# Patient Record
Sex: Female | Born: 1980
Health system: Southern US, Community
[De-identification: ages and names within clinical notes are randomized; demographics above are authoritative.]

## PROBLEM LIST (undated history)

## (undated) DIAGNOSIS — B019 Varicella without complication: Secondary | ICD-10-CM

## (undated) DIAGNOSIS — Z8619 Personal history of other infectious and parasitic diseases: Secondary | ICD-10-CM

## (undated) HISTORY — DX: Varicella without complication: B01.9

## (undated) HISTORY — PX: ECTOPIC PREGNANCY SURGERY: SHX613

---

## 2003-06-27 ENCOUNTER — Emergency Department (HOSPITAL_COMMUNITY): Admission: EM | Admit: 2003-06-27 | Discharge: 2003-06-27 | Payer: Self-pay | Admitting: Emergency Medicine

## 2004-01-04 ENCOUNTER — Emergency Department: Payer: Self-pay | Admitting: Emergency Medicine

## 2004-11-08 ENCOUNTER — Emergency Department (HOSPITAL_COMMUNITY): Admission: EM | Admit: 2004-11-08 | Discharge: 2004-11-08 | Payer: Self-pay | Admitting: Family Medicine

## 2005-07-02 ENCOUNTER — Emergency Department (HOSPITAL_COMMUNITY): Admission: EM | Admit: 2005-07-02 | Discharge: 2005-07-02 | Payer: Self-pay | Admitting: Emergency Medicine

## 2006-06-29 ENCOUNTER — Emergency Department (HOSPITAL_COMMUNITY): Admission: EM | Admit: 2006-06-29 | Discharge: 2006-06-29 | Payer: Self-pay | Admitting: Family Medicine

## 2013-04-12 ENCOUNTER — Other Ambulatory Visit (HOSPITAL_COMMUNITY): Payer: Self-pay | Admitting: Obstetrics and Gynecology

## 2013-04-12 DIAGNOSIS — IMO0002 Reserved for concepts with insufficient information to code with codable children: Secondary | ICD-10-CM

## 2013-04-18 ENCOUNTER — Ambulatory Visit (HOSPITAL_COMMUNITY)
Admission: RE | Admit: 2013-04-18 | Discharge: 2013-04-18 | Disposition: A | Discharge status: Home / Self Care | Payer: No Typology Code available for payment source | Source: Ambulatory Visit | Attending: Obstetrics and Gynecology | Admitting: Obstetrics and Gynecology

## 2013-04-18 DIAGNOSIS — N979 Female infertility, unspecified: Secondary | ICD-10-CM | POA: Insufficient documentation

## 2013-04-18 DIAGNOSIS — IMO0002 Reserved for concepts with insufficient information to code with codable children: Secondary | ICD-10-CM

## 2013-04-18 MED ORDER — IOHEXOL 300 MG/ML  SOLN
20.0000 mL | Freq: Once | INTRAMUSCULAR | Status: AC | PRN
  Administered 2013-04-18: 20 mL

## 2014-07-24 DIAGNOSIS — Z3183 Encounter for assisted reproductive fertility procedure cycle: Secondary | ICD-10-CM | POA: Insufficient documentation

## 2015-02-18 NOTE — L&D Delivery Note (Signed)
Pt was admitted by Dr. Rogue Bussing for an induction for postdates. She started on Pitocin. She progressed very slowly. Once she reached 6 cm she progressed along a nl labor curve. She developed variable decels txd with an amnioinfusion. She pushed for 1 1/2 hours. She had the VE placed to shorten the second stage in light of the decels. She delivered one live viable black female infant on second push, Nuchal cord x 1. Placenta-S/I. Left labial tear closed with 3-0 chromic. Baby to NBN. EBl-400cc. No pop offs

## 2015-02-20 MED FILL — PROGESTERONE OIL 50 MG/ML V: 50 | 20 days supply | Qty: 20 | Fill #0

## 2015-02-20 MED FILL — METHYLPREDNISOLONE 16 MG TA: 16 | 4 days supply | Qty: 4 | Fill #0

## 2015-02-21 MED FILL — BD NEEDLES 22GX1.5: 22G X 1-1/2 | 30 days supply | Qty: 30 | Fill #0

## 2015-03-02 DIAGNOSIS — Z3183 Encounter for assisted reproductive fertility procedure cycle: Secondary | ICD-10-CM | POA: Diagnosis not present

## 2015-03-08 DIAGNOSIS — Z3183 Encounter for assisted reproductive fertility procedure cycle: Secondary | ICD-10-CM | POA: Diagnosis not present

## 2015-03-10 ENCOUNTER — Encounter (HOSPITAL_COMMUNITY): Payer: Self-pay | Admitting: Emergency Medicine

## 2015-03-10 ENCOUNTER — Emergency Department (HOSPITAL_COMMUNITY)
Admission: EM | Admit: 2015-03-10 | Discharge: 2015-03-10 | Disposition: A | Discharge status: Home / Self Care | Payer: 59 | Attending: Emergency Medicine | Admitting: Emergency Medicine

## 2015-03-10 ENCOUNTER — Emergency Department (HOSPITAL_COMMUNITY): Payer: 59

## 2015-03-10 DIAGNOSIS — R079 Chest pain, unspecified: Secondary | ICD-10-CM | POA: Diagnosis not present

## 2015-03-10 DIAGNOSIS — R109 Unspecified abdominal pain: Secondary | ICD-10-CM | POA: Diagnosis not present

## 2015-03-10 DIAGNOSIS — R12 Heartburn: Secondary | ICD-10-CM | POA: Insufficient documentation

## 2015-03-10 DIAGNOSIS — K2100 Gastro-esophageal reflux disease with esophagitis, without bleeding: Secondary | ICD-10-CM

## 2015-03-10 DIAGNOSIS — R1013 Epigastric pain: Secondary | ICD-10-CM | POA: Diagnosis present

## 2015-03-10 LAB — CBC WITH DIFFERENTIAL/PLATELET
BASOS ABS: 0 10*3/uL (ref 0.0–0.1)
Basophils Relative: 0 %
Eosinophils Absolute: 0.1 10*3/uL (ref 0.0–0.7)
Eosinophils Relative: 2 %
HCT: 41.5 % (ref 36.0–46.0)
HEMOGLOBIN: 13.7 g/dL (ref 12.0–15.0)
LYMPHS ABS: 3 10*3/uL (ref 0.7–4.0)
LYMPHS PCT: 36 %
MCH: 29.1 pg (ref 26.0–34.0)
MCHC: 33 g/dL (ref 30.0–36.0)
MCV: 88.1 fL (ref 78.0–100.0)
Monocytes Absolute: 0.3 10*3/uL (ref 0.1–1.0)
Monocytes Relative: 4 %
NEUTROS ABS: 4.8 10*3/uL (ref 1.7–7.7)
NEUTROS PCT: 58 %
PLATELETS: 238 10*3/uL (ref 150–400)
RBC: 4.71 MIL/uL (ref 3.87–5.11)
RDW: 12.8 % (ref 11.5–15.5)
WBC: 8.2 10*3/uL (ref 4.0–10.5)

## 2015-03-10 LAB — COMPREHENSIVE METABOLIC PANEL
ALBUMIN: 3.4 g/dL — AB (ref 3.5–5.0)
ALT: 15 U/L (ref 14–54)
ANION GAP: 9 (ref 5–15)
AST: 19 U/L (ref 15–41)
Alkaline Phosphatase: 53 U/L (ref 38–126)
BUN: 10 mg/dL (ref 6–20)
CALCIUM: 9.4 mg/dL (ref 8.9–10.3)
CHLORIDE: 100 mmol/L — AB (ref 101–111)
CO2: 29 mmol/L (ref 22–32)
Creatinine, Ser: 0.87 mg/dL (ref 0.44–1.00)
GFR calc non Af Amer: >60 mL/min (ref >60)
GLUCOSE: 93 mg/dL (ref 65–99)
POTASSIUM: 3.9 mmol/L (ref 3.5–5.1)
Sodium: 138 mmol/L (ref 135–145)
Total Bilirubin: 0.3 mg/dL (ref 0.3–1.2)
Total Protein: 6.9 g/dL (ref 6.5–8.1)

## 2015-03-10 LAB — LIPASE, BLOOD: Lipase: 29 U/L (ref 11–51)

## 2015-03-10 MED ORDER — ONDANSETRON HCL 4 MG/2ML IJ SOLN
4.0000 mg | Freq: Once | INTRAMUSCULAR | Status: AC
Start: 2015-03-10 — End: 2015-03-10
  Administered 2015-03-10: 4 mg via INTRAVENOUS
  Filled 2015-03-10: qty 2

## 2015-03-10 MED ORDER — FAMOTIDINE 20 MG PO TABS: 20.0000 mg | ORAL_TABLET | Freq: Two times a day (BID) | ORAL | Status: DC

## 2015-03-10 MED ORDER — SODIUM CHLORIDE 0.9 % IV SOLN
INTRAVENOUS | Status: DC
  Administered 2015-03-10: 125 mL/h via INTRAVENOUS

## 2015-03-10 MED ORDER — HYDROMORPHONE HCL 1 MG/ML IJ SOLN
1.0000 mg | Freq: Once | INTRAMUSCULAR | Status: AC
  Administered 2015-03-10: 1 mg via INTRAVENOUS
  Filled 2015-03-10: qty 1

## 2015-03-10 MED ORDER — SODIUM CHLORIDE 0.9 % IV BOLUS (SEPSIS)
1000.0000 mL | Freq: Once | INTRAVENOUS | Status: AC
  Administered 2015-03-10: 1000 mL via INTRAVENOUS

## 2015-03-10 MED ORDER — SUCRALFATE 1 G PO TABS: 1.0000 g | ORAL_TABLET | Freq: Four times a day (QID) | ORAL | Status: DC

## 2015-03-10 MED ORDER — FAMOTIDINE IN NACL 20-0.9 MG/50ML-% IV SOLN
20.0000 mg | Freq: Once | INTRAVENOUS | Status: AC
Start: 2015-03-10 — End: 2015-03-10
  Administered 2015-03-10: 20 mg via INTRAVENOUS
  Filled 2015-03-10: qty 50

## 2015-03-10 NOTE — ED Provider Notes (Signed)
CSN: [id number]     Arrival date & time 03/10/15  0423 History   First MD Initiated Contact with Patient 03/10/15 [phone]     Chief Complaint  Patient presents with  . Abdominal Pain  . Chest Pain     (Consider location/radiation/quality/duration/timing/severity/associated sxs/prior Treatment) HPI Comments: Patient here complaining of epigastric pain that began after she ate a greasy meal. Had emesis 4 after this. Endorses heartburn as well as increased belching. Took Mylanta without relief. Father she was constipated into the laxative with also no relief. Denies any fever or chills. No diarrhea. Pain does radiate to her back and characterized as burning. Denies any anginal quality to this. Symptoms have been gradually getting better  Patient is a 35 y.o. female presenting with abdominal pain and chest pain. The history is provided by the patient.  Abdominal Pain Associated symptoms: chest pain   Chest Pain Associated symptoms: abdominal pain     History reviewed. No pertinent past medical history. Past Surgical History  Procedure Laterality Date  . Cesarean section     History reviewed. No pertinent family history. Social History  Substance Use Topics  . Smoking status: Never Smoker   . Smokeless tobacco: None  . Alcohol Use: No   OB History    No data available     Review of Systems  Cardiovascular: Positive for chest pain.  Gastrointestinal: Positive for abdominal pain.  All other systems reviewed and are negative.     Allergies  Review of patient's allergies indicates no known allergies.  Home Medications   Prior to Admission medications   Not on File   BP 144/92 mmHg  Pulse 82  Temp(Src) 98.1 F (36.7 C) (Oral)  Resp 20  Ht 5\' 9"  (1.753 m)  Wt 90.719 kg  BMI 29.52 kg/m2  SpO2 100% Physical Exam  Constitutional: She is oriented to person, place, and time. She appears well-developed and well-nourished.  Non-toxic appearance. No distress.  HENT:  Head:  Normocephalic and atraumatic.  Eyes: Conjunctivae, EOM and lids are normal. Pupils are equal, round, and reactive to light.  Neck: Normal range of motion. Neck supple. No tracheal deviation present. No thyroid mass present.  Cardiovascular: Normal rate, regular rhythm and normal heart sounds.  Exam reveals no gallop.   No murmur heard. Pulmonary/Chest: Effort normal and breath sounds normal. No stridor. No respiratory distress. She has no decreased breath sounds. She has no wheezes. She has no rhonchi. She has no rales.  Abdominal: Soft. Normal appearance and bowel sounds are normal. She exhibits no distension. There is tenderness in the epigastric area. There is no rigidity, no rebound, no guarding and no CVA tenderness.  Musculoskeletal: Normal range of motion. She exhibits no edema or tenderness.  Neurological: She is alert and oriented to person, place, and time. She has normal strength. No cranial nerve deficit or sensory deficit. GCS eye subscore is 4. GCS verbal subscore is 5. GCS motor subscore is 6.  Skin: Skin is warm and dry. No abrasion and no rash noted.  Psychiatric: She has a normal mood and affect. Her speech is normal and behavior is normal.  Nursing note and vitals reviewed.   ED Course  Procedures (including critical care time) Labs Review Labs Reviewed  CBC WITH DIFFERENTIAL/PLATELET  COMPREHENSIVE METABOLIC PANEL  LIPASE, BLOOD    Imaging Review No results found. I have personally reviewed and evaluated these images and lab results as part of my medical decision-making.   EKG Interpretation  Date/Time:  Saturday March 10 2015 04:34:24 EST Ventricular Rate:  88 PR Interval:  152 QRS Duration: 78 QT Interval:  338 QTC Calculation: 408 R Axis:   84 Text Interpretation:  Normal sinus rhythm Normal ECG Confirmed by Khari Mally   MD, Keira Bohlin ([id number]) on 03/10/2015 4:37:19 AM      MDM   Final diagnoses:  None    Pt feels better after meds, labs unremarkable,  suspect gerd, pt stable for d/c    Lacretia Leigh, MD 03/10/15 [phone]

## 2015-03-10 NOTE — ED Notes (Signed)
Pt states she has some abd pain and heartburn since 1 am, she is having some mid cp that goes to her back and to her right shoulder, pt took some Mylanta and a laxative with no relief. Pt having nausea and vomited 4 times since 1 am.

## 2015-03-10 NOTE — ED Notes (Signed)
MD at bedside. 

## 2015-03-10 NOTE — Discharge Instructions (Signed)
Esophagitis Esophagitis is inflammation of the esophagus. The esophagus is the tube that carries food and liquids from your mouth to your stomach. Esophagitis can cause soreness or pain in the esophagus. This condition can make it difficult and painful to swallow.  CAUSES Most causes of esophagitis are not serious. Common causes of this condition include:  Gastroesophageal reflux disease (GERD). This is when stomach contents move back up into the esophagus (reflux).  Repeated vomiting.  An allergic-type reaction, especially caused by food allergies (eosinophilic esophagitis).  Injury to the esophagus by swallowing large pills with or without water, or swallowing certain types of medicines.  Swallowing (ingesting) harmful chemicals, such as household cleaning products.  Heavy alcohol use.  An infection of the esophagus.This most often occurs in people who have a weakened immune system.  Radiation or chemotherapy treatment for cancer.  Certain diseases such as sarcoidosis, Crohn disease, and scleroderma. SYMPTOMS Symptoms of this condition include:  Difficult or painful swallowing.  Pain with swallowing acidic liquids, such as citrus juices.  Pain with burping.  Chest pain.  Difficulty breathing.  Nausea.  Vomiting.  Pain in the abdomen.  Weight loss.  Ulcers in the mouth.  Patches of white material in the mouth (candidiasis).  Fever.  Coughing up blood or vomiting blood.  Stool that is black, tarry, or bright red. DIAGNOSIS Your health care provider will take a medical history and perform a physical exam. You may also have other tests, including:  An endoscopy to examine your stomach and esophagus with a small camera.  A test that measures the acidity level in your esophagus.  A test that measures how much pressure is on your esophagus.  A barium swallow or modified barium swallow to show the shape, size, and functioning of your esophagus.  Allergy  tests. TREATMENT Treatment for this condition depends on the cause of your esophagitis. In some cases, steroids or other medicines may be given to help relieve your symptoms or to treat the underlying cause of your condition. You may have to make some lifestyle changes, such as:  Avoiding alcohol.  Quitting smoking.  Changing your diet.  Exercising.  Changing your sleep habits and your sleep environment. HOME CARE INSTRUCTIONS Take these actions to decrease your discomfort and to help avoid complications. Diet  Follow a diet as recommended by your health care provider. This may involve avoiding foods and drinks such as:  Coffee and tea (with or without caffeine).  Drinks that contain alcohol.  Energy drinks and sports drinks.  Carbonated drinks or sodas.  Chocolate and cocoa.  Peppermint and mint flavorings.  Garlic and onions.  Horseradish.  Spicy and acidic foods, including peppers, chili powder, curry powder, vinegar, hot sauces, and barbecue sauce.  Citrus fruit juices and citrus fruits, such as oranges, lemons, and limes.  Tomato-based foods, such as red sauce, chili, salsa, and pizza with red sauce.  Fried and fatty foods, such as donuts, french fries, potato chips, and high-fat dressings.  High-fat meats, such as hot dogs and fatty cuts of red and white meats, such as rib eye steak, sausage, ham, and bacon.  High-fat dairy items, such as whole milk, butter, and cream cheese.  Eat small, frequent meals instead of large meals.  Avoid drinking large amounts of liquid with your meals.  Avoid eating meals during the 2-3 hours before bedtime.  Avoid lying down right after you eat.  Do not exercise right after you eat.  Avoid foods and drinks that seem to  make your symptoms worse. General Instructions  Pay attention to any changes in your symptoms.  Take over-the-counter and prescription medicines only as told by your health care provider. Do not take  aspirin, ibuprofen, or other NSAIDs unless your health care provider told you to do so.  If you have trouble taking pills, use a pill splitter to decrease the size of the pill. This will decrease the chance of the pill getting stuck or injuring your esophagus on the way down. Also, drink water after you take a pill.  Do not use any tobacco products, including cigarettes, chewing tobacco, and e-cigarettes. If you need help quitting, ask your health care provider.  Wear loose-fitting clothing. Do not wear anything tight around your waist that causes pressure on your abdomen.  Raise (elevate) the head of your bed about 6 inches (15 cm).  Try to reduce your stress, such as with yoga or meditation. If you need help reducing stress, ask your health care provider.  If you are overweight, reduce your weight to an amount that is healthy for you. Ask your health care provider for guidance about a safe weight loss goal.  Keep all follow-up visits as told by your health care provider. This is important. SEEK MEDICAL CARE IF:  You have new symptoms.  You have unexplained weight loss.  You have difficulty swallowing, or it hurts to swallow.  You have wheezing or a persistent cough.  Your symptoms do not improve with treatment.  You have frequent heartburn for more than two weeks. SEEK IMMEDIATE MEDICAL CARE IF:  You have severe pain in your arms, neck, jaw, teeth, or back.  You feel sweaty, dizzy, or light-headed.  You have chest pain or shortness of breath.  You vomit and your vomit looks like blood or coffee grounds.  Your stool is bloody or black.  You have a fever.  You cannot swallow, drink, or eat.   This information is not intended to replace advice given to you by your health care provider. Make sure you discuss any questions you have with your health care provider.   Document Released: 03/13/2004 Document Revised: 10/25/2014 Document Reviewed: 05/31/2014 Elsevier Interactive  Patient Education 2016 [place]. Gastroesophageal Reflux Disease, Adult Normally, food travels down the esophagus and stays in the stomach to be digested. However, when a person has gastroesophageal reflux disease (GERD), food and stomach acid move back up into the esophagus. When this happens, the esophagus becomes sore and inflamed. Over time, GERD can create small holes (ulcers) in the lining of the esophagus.  CAUSES This condition is caused by a problem with the muscle between the esophagus and the stomach (lower esophageal sphincter, or LES). Normally, the LES muscle closes after food passes through the esophagus to the stomach. When the LES is weakened or abnormal, it does not close properly, and that allows food and stomach acid to go back up into the esophagus. The LES can be weakened by certain dietary substances, medicines, and medical conditions, including:  Tobacco use.  Pregnancy.  Having a hiatal hernia.  Heavy alcohol use.  Certain foods and beverages, such as coffee, chocolate, onions, and peppermint. RISK FACTORS This condition is more likely to develop in:  People who have an increased body weight.  People who have connective tissue disorders.  People who use NSAID medicines. SYMPTOMS Symptoms of this condition include:  Heartburn.  Difficult or painful swallowing.  The feeling of having a lump in the throat.  Abitter taste in  the mouth.  Bad breath.  Having a large amount of saliva.  Having an upset or bloated stomach.  Belching.  Chest pain.  Shortness of breath or wheezing.  Ongoing (chronic) cough or a night-time cough.  Wearing away of tooth enamel.  Weight loss. Different conditions can cause chest pain. Make sure to see your health care provider if you experience chest pain. DIAGNOSIS Your health care provider will take a medical history and perform a physical exam. To determine if you have mild or severe GERD, your health care  provider may also monitor how you respond to treatment. You may also have other tests, including:  An endoscopy toexamine your stomach and esophagus with a small camera.  A test thatmeasures the acidity level in your esophagus.  A test thatmeasures how much pressure is on your esophagus.  A barium swallow or modified barium swallow to show the shape, size, and functioning of your esophagus. TREATMENT The goal of treatment is to help relieve your symptoms and to prevent complications. Treatment for this condition may vary depending on how severe your symptoms are. Your health care provider may recommend:  Changes to your diet.  Medicine.  Surgery. HOME CARE INSTRUCTIONS Diet  Follow a diet as recommended by your health care provider. This may involve avoiding foods and drinks such as:  Coffee and tea (with or without caffeine).  Drinks that containalcohol.  Energy drinks and sports drinks.  Carbonated drinks or sodas.  Chocolate and cocoa.  Peppermint and mint flavorings.  Garlic and onions.  Horseradish.  Spicy and acidic foods, including peppers, chili powder, curry powder, vinegar, hot sauces, and barbecue sauce.  Citrus fruit juices and citrus fruits, such as oranges, lemons, and limes.  Tomato-based foods, such as red sauce, chili, salsa, and pizza with red sauce.  Fried and fatty foods, such as donuts, french fries, potato chips, and high-fat dressings.  High-fat meats, such as hot dogs and fatty cuts of red and white meats, such as rib eye steak, sausage, ham, and bacon.  High-fat dairy items, such as whole milk, butter, and cream cheese.  Eat small, frequent meals instead of large meals.  Avoid drinking large amounts of liquid with your meals.  Avoid eating meals during the 2-3 hours before bedtime.  Avoid lying down right after you eat.  Do not exercise right after you eat. General Instructions  Pay attention to any changes in your  symptoms.  Take over-the-counter and prescription medicines only as told by your health care provider. Do not take aspirin, ibuprofen, or other NSAIDs unless your health care provider told you to do so.  Do not use any tobacco products, including cigarettes, chewing tobacco, and e-cigarettes. If you need help quitting, ask your health care provider.  Wear loose-fitting clothing. Do not wear anything tight around your waist that causes pressure on your abdomen.  Raise (elevate) the head of your bed 6 inches (15cm).  Try to reduce your stress, such as with yoga or meditation. If you need help reducing stress, ask your health care provider.  If you are overweight, reduce your weight to an amount that is healthy for you. Ask your health care provider for guidance about a safe weight loss goal.  Keep all follow-up visits as told by your health care provider. This is important. SEEK MEDICAL CARE IF:  You have new symptoms.  You have unexplained weight loss.  You have difficulty swallowing, or it hurts to swallow.  You have wheezing or a  persistent cough.  Your symptoms do not improve with treatment.  You have a hoarse voice. SEEK IMMEDIATE MEDICAL CARE IF:  You have pain in your arms, neck, jaw, teeth, or back.  You feel sweaty, dizzy, or light-headed.  You have chest pain or shortness of breath.  You vomit and your vomit looks like blood or coffee grounds.  You faint.  Your stool is bloody or black.  You cannot swallow, drink, or eat.   This information is not intended to replace advice given to you by your health care provider. Make sure you discuss any questions you have with your health care provider.   Document Released: 11/13/2004 Document Revised: 10/25/2014 Document Reviewed: 05/31/2014 Elsevier Interactive Patient Education [employer].

## 2015-03-12 MED FILL — FLUCONAZOLE 150 MG TABLET: 150 | 1 days supply | Qty: 1 | Fill #0

## 2015-03-13 DIAGNOSIS — H5213 Myopia, bilateral: Secondary | ICD-10-CM | POA: Diagnosis not present

## 2015-03-16 DIAGNOSIS — Z3200 Encounter for pregnancy test, result unknown: Secondary | ICD-10-CM | POA: Diagnosis not present

## 2015-03-16 MED FILL — ESTRADIOL 2 MG TABLET: 2 | 30 days supply | Qty: 60 | Fill #1

## 2015-03-19 MED FILL — PROGESTERONE OIL 50 MG/ML V: 50 | 20 days supply | Qty: 20 | Fill #1

## 2015-03-20 DIAGNOSIS — Z3141 Encounter for fertility testing: Secondary | ICD-10-CM | POA: Diagnosis not present

## 2015-03-27 MED FILL — BD 3 ML SYRINGE 18GX1-1/2: 18G X 1-1/2 | 30 days supply | Qty: 50 | Fill #1

## 2015-03-27 MED FILL — ESTRADIOL 0.1 MG PATCH: 0.1 | 48 days supply | Qty: 16 | Fill #1

## 2015-04-02 DIAGNOSIS — Z3A00 Weeks of gestation of pregnancy not specified: Secondary | ICD-10-CM | POA: Diagnosis not present

## 2015-04-02 DIAGNOSIS — O09811 Supervision of pregnancy resulting from assisted reproductive technology, first trimester: Secondary | ICD-10-CM | POA: Diagnosis not present

## 2015-04-04 MED FILL — DICLEGIS DR 10-10 MG TABLET: 10-10 | 15 days supply | Qty: 30 | Fill #0

## 2015-04-05 MED FILL — BD NEEDLES 22GX1.5: 22G X 1-1/2 | 30 days supply | Qty: 30 | Fill #1

## 2015-04-05 MED FILL — PROGESTERONE OIL 50 MG/ML V: 50 | 20 days supply | Qty: 20 | Fill #2

## 2015-04-16 DIAGNOSIS — O09811 Supervision of pregnancy resulting from assisted reproductive technology, first trimester: Secondary | ICD-10-CM | POA: Diagnosis not present

## 2015-04-16 DIAGNOSIS — Z3A00 Weeks of gestation of pregnancy not specified: Secondary | ICD-10-CM | POA: Diagnosis not present

## 2015-04-24 DIAGNOSIS — O3680X1 Pregnancy with inconclusive fetal viability, fetus 1: Secondary | ICD-10-CM | POA: Diagnosis not present

## 2015-04-24 DIAGNOSIS — Z01419 Encounter for gynecological examination (general) (routine) without abnormal findings: Secondary | ICD-10-CM | POA: Diagnosis not present

## 2015-04-24 DIAGNOSIS — Z124 Encounter for screening for malignant neoplasm of cervix: Secondary | ICD-10-CM | POA: Diagnosis not present

## 2015-04-24 DIAGNOSIS — Z3480 Encounter for supervision of other normal pregnancy, unspecified trimester: Secondary | ICD-10-CM | POA: Diagnosis not present

## 2015-04-24 LAB — OB RESULTS CONSOLE ABO/RH: RH TYPE: POSITIVE

## 2015-04-24 LAB — OB RESULTS CONSOLE ANTIBODY SCREEN: Antibody Screen: NEGATIVE

## 2015-04-24 LAB — OB RESULTS CONSOLE RPR: RPR: NONREACTIVE

## 2015-04-24 LAB — OB RESULTS CONSOLE GC/CHLAMYDIA
Chlamydia: NEGATIVE
Gonorrhea: NEGATIVE

## 2015-04-24 LAB — OB RESULTS CONSOLE HEPATITIS B SURFACE ANTIGEN: HEP B S AG: NEGATIVE

## 2015-04-24 LAB — OB RESULTS CONSOLE HIV ANTIBODY (ROUTINE TESTING): HIV: NONREACTIVE

## 2015-04-24 LAB — OB RESULTS CONSOLE RUBELLA ANTIBODY, IGM: RUBELLA: IMMUNE

## 2015-04-25 MED FILL — PROGESTERONE OIL 50 MG/ML V: 50 | 20 days supply | Qty: 20 | Fill #3

## 2015-06-21 DIAGNOSIS — Z36 Encounter for antenatal screening of mother: Secondary | ICD-10-CM | POA: Diagnosis not present

## 2015-07-17 DIAGNOSIS — O359XX1 Maternal care for (suspected) fetal abnormality and damage, unspecified, fetus 1: Secondary | ICD-10-CM | POA: Diagnosis not present

## 2015-07-17 DIAGNOSIS — O09519 Supervision of elderly primigravida, unspecified trimester: Secondary | ICD-10-CM | POA: Diagnosis not present

## 2015-08-17 ENCOUNTER — Telehealth: Payer: Self-pay | Admitting: Cardiology

## 2015-08-22 NOTE — Telephone Encounter (Signed)
Closed encounter °

## 2015-09-05 DIAGNOSIS — Z3480 Encounter for supervision of other normal pregnancy, unspecified trimester: Secondary | ICD-10-CM | POA: Diagnosis not present

## 2015-09-05 DIAGNOSIS — Z23 Encounter for immunization: Secondary | ICD-10-CM | POA: Diagnosis not present

## 2015-09-21 ENCOUNTER — Ambulatory Visit: Payer: No Typology Code available for payment source | Admitting: Cardiology

## 2015-10-02 DIAGNOSIS — O368131 Decreased fetal movements, third trimester, fetus 1: Secondary | ICD-10-CM | POA: Diagnosis not present

## 2015-10-23 DIAGNOSIS — Z36 Encounter for antenatal screening of mother: Secondary | ICD-10-CM | POA: Diagnosis not present

## 2015-10-23 DIAGNOSIS — Z3480 Encounter for supervision of other normal pregnancy, unspecified trimester: Secondary | ICD-10-CM | POA: Diagnosis not present

## 2015-10-23 LAB — OB RESULTS CONSOLE GBS: GBS: POSITIVE

## 2015-11-06 DIAGNOSIS — O09523 Supervision of elderly multigravida, third trimester: Secondary | ICD-10-CM | POA: Diagnosis not present

## 2015-11-10 ENCOUNTER — Inpatient Hospital Stay (HOSPITAL_COMMUNITY)
Admission: AD | Admit: 2015-11-10 | Discharge: 2015-11-10 | Disposition: A | Discharge status: Home / Self Care | Payer: 59 | Source: Ambulatory Visit | Attending: Obstetrics & Gynecology | Admitting: Obstetrics & Gynecology

## 2015-11-10 ENCOUNTER — Encounter (HOSPITAL_COMMUNITY): Payer: Self-pay | Admitting: *Deleted

## 2015-11-10 DIAGNOSIS — O212 Late vomiting of pregnancy: Secondary | ICD-10-CM | POA: Diagnosis not present

## 2015-11-10 DIAGNOSIS — Z3A38 38 weeks gestation of pregnancy: Secondary | ICD-10-CM | POA: Diagnosis not present

## 2015-11-10 DIAGNOSIS — K529 Noninfective gastroenteritis and colitis, unspecified: Secondary | ICD-10-CM | POA: Diagnosis not present

## 2015-11-10 DIAGNOSIS — Z79899 Other long term (current) drug therapy: Secondary | ICD-10-CM | POA: Insufficient documentation

## 2015-11-10 DIAGNOSIS — O99613 Diseases of the digestive system complicating pregnancy, third trimester: Secondary | ICD-10-CM | POA: Diagnosis not present

## 2015-11-10 DIAGNOSIS — O218 Other vomiting complicating pregnancy: Secondary | ICD-10-CM | POA: Diagnosis present

## 2015-11-10 DIAGNOSIS — Z3689 Encounter for other specified antenatal screening: Secondary | ICD-10-CM

## 2015-11-10 DIAGNOSIS — Z3493 Encounter for supervision of normal pregnancy, unspecified, third trimester: Secondary | ICD-10-CM

## 2015-11-10 HISTORY — DX: Personal history of other infectious and parasitic diseases: Z86.19

## 2015-11-10 LAB — URINALYSIS, ROUTINE W REFLEX MICROSCOPIC
Bilirubin Urine: NEGATIVE
GLUCOSE, UA: NEGATIVE mg/dL
Hgb urine dipstick: NEGATIVE
Ketones, ur: 15 mg/dL — AB
LEUKOCYTES UA: NEGATIVE
Nitrite: NEGATIVE
PH: 6.5 (ref 5.0–8.0)
PROTEIN: NEGATIVE mg/dL
SPECIFIC GRAVITY, URINE: 1.02 (ref 1.005–1.030)

## 2015-11-10 MED ORDER — LACTATED RINGERS IV BOLUS (SEPSIS)
1000.0000 mL | Freq: Once | INTRAVENOUS | Status: AC
Start: 2015-11-10 — End: 2015-11-10
  Administered 2015-11-10: 1000 mL via INTRAVENOUS

## 2015-11-10 MED ORDER — PROMETHAZINE HCL 25 MG PO TABS: 25.0000 mg | ORAL_TABLET | Freq: Four times a day (QID) | ORAL | 0 refills | Status: DC | PRN

## 2015-11-10 MED ORDER — PROMETHAZINE HCL 25 MG PO TABS
25.0000 mg | ORAL_TABLET | Freq: Four times a day (QID) | ORAL | Status: DC | PRN
Start: 2015-11-10 — End: 2015-11-11
  Administered 2015-11-10: 25 mg via ORAL
  Filled 2015-11-10: qty 1

## 2015-11-10 NOTE — Discharge Instructions (Signed)

## 2015-11-10 NOTE — MAU Provider Note (Signed)
History     CSN: [id number]  Arrival date and time: 11/10/15 2018   First Provider Initiated Contact with Patient 11/10/15 2057      Chief Complaint  Patient presents with  . Emesis During Pregnancy   G1 @38 .6 weeks c/o N/V x24 hrs. She is able to tolerate fluids at times but not food. She denies sick contacts or food poisoning. She denies diarrhea. No fevers or chills. She reports good FM. No VB, LOF, or ctx. Pregnancy has been uncomplicated.     OB History    Gravida Para Term Preterm AB Living   3 1 1     1    SAB TAB Ectopic Multiple Live Births           1      Past Medical History:  Diagnosis Date  . H/O cold sores     Past Surgical History:  Procedure Laterality Date  . CESAREAN SECTION      Family History  Problem Relation Age of Onset  . Hypertension Mother     Social History  Substance Use Topics  . Smoking status: Never Smoker  . Smokeless tobacco: Never Used  . Alcohol use No    Allergies:  Allergies  Allergen Reactions  . Cephalexin Rash    Peeled skin    Prescriptions Prior to Admission  Medication Sig Dispense Refill Last Dose  . Prenatal Vit-Fe Fumarate-FA (PRENATAL MULTIVITAMIN) TABS tablet Take 1 tablet by mouth daily at 12 noon.   11/10/2015 at Unknown time  . PRESCRIPTION MEDICATION Valtrex as needed for cold sores   Past Week at Unknown time  . estradiol (ESTRACE) 2 MG tablet Take 2 mg by mouth 2 (two) times daily.   03/09/2015 at Unknown time  . estradiol (VIVELLE-DOT) 0.1 MG/24HR patch Place 1 patch onto the skin every 3 (three) days.   03/08/2015  . famotidine (PEPCID) 20 MG tablet Take 1 tablet (20 mg total) by mouth 2 (two) times daily. 30 tablet 0   . PROGESTERONE IM Inject 1 application into the muscle daily.   03/09/2015 at Unknown time  . sucralfate (CARAFATE) 1 g tablet Take 1 tablet (1 g total) by mouth 4 (four) times daily. 30 tablet 0     Review of Systems  Constitutional: Negative.   Gastrointestinal: Positive for  nausea and vomiting. Negative for abdominal pain.   Physical Exam   Blood pressure 131/77, pulse 91, temperature 98.3 F (36.8 C), resp. rate 18, height 5\' 9"  (1.753 m), weight 113 kg (249 lb 1.9 oz).  Physical Exam  Constitutional: She is oriented to person, place, and time. She appears well-developed and well-nourished.  HENT:  Head: Normocephalic and atraumatic.  Neck: Normal range of motion.  Cardiovascular: Normal rate.   Respiratory: Effort normal.  GI: Soft. She exhibits no distension. There is no tenderness.  gravid  Musculoskeletal: Normal range of motion.  Neurological: She is alert and oriented to person, place, and time.  Skin: Skin is warm and dry.  Psychiatric: She has a normal mood and affect.  EFM: 140 bpm, mod variability, + accels, no decels Toco: irregular, mild  Results for orders placed or performed during the hospital encounter of 11/10/15 (from the past 24 hour(s))  Urinalysis, Routine w reflex microscopic (not at [facility])     Status: Abnormal   Collection Time: 11/10/15  8:40 PM  Result Value Ref Range   Color, Urine YELLOW YELLOW   APPearance CLEAR CLEAR   Specific Gravity, Urine 1.020  1.005 - 1.030   pH 6.5 5.0 - 8.0   Glucose, UA NEGATIVE NEGATIVE mg/dL   Hgb urine dipstick NEGATIVE NEGATIVE   Bilirubin Urine NEGATIVE NEGATIVE   Ketones, ur 15 (A) NEGATIVE mg/dL   Protein, ur NEGATIVE NEGATIVE mg/dL   Nitrite NEGATIVE NEGATIVE   Leukocytes, UA NEGATIVE NEGATIVE    MAU Course  Procedures Phenergan 25mg  po x1 LR 1 L bolus  MDM Labs ordered and reviewed. Mild dehydration noted. Pt tolerating food and po fluids after meds. No nausea. No episodes of emesis. Discussed presentation, clinical findings, and plan with Dr. Alwyn Pea. Stable for discharge home.  Assessment and Plan   1. Third trimester pregnancy   2. Gastroenteritis   3. NST (non-stress test) reactive    Discharge home BRAT diet Rest Follow up in office this week as scheduled     Medication List    STOP taking these medications   estradiol 0.1 MG/24HR patch Commonly known as:  VIVELLE-DOT   estradiol 2 MG tablet Commonly known as:  ESTRACE   famotidine 20 MG tablet Commonly known as:  PEPCID   PROGESTERONE IM   sucralfate 1 g tablet Commonly known as:  CARAFATE     TAKE these medications   prenatal multivitamin Tabs tablet Take 1 tablet by mouth daily at 12 noon.   PRESCRIPTION MEDICATION Valtrex as needed for cold sores   promethazine 25 MG tablet Commonly known as:  PHENERGAN Take 1 tablet (25 mg total) by mouth every 6 (six) hours as needed for nausea or vomiting.       Julianne Handler, CNM 11/10/2015, 9:05 PM

## 2015-11-10 NOTE — MAU Note (Signed)
Started 2 days ago and had scratchy throat. Last night started vomiting everytime I ate. 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 No diarrhea. Denies LOF or fld

## 2015-11-10 NOTE — Progress Notes (Signed)
Ok to d/c efm per Colman Cater CNM

## 2015-11-12 ENCOUNTER — Telehealth (HOSPITAL_COMMUNITY): Payer: Self-pay | Admitting: *Deleted

## 2015-11-12 ENCOUNTER — Encounter (HOSPITAL_COMMUNITY): Payer: Self-pay | Admitting: *Deleted

## 2015-11-12 NOTE — Telephone Encounter (Signed)
Preadmission screen  

## 2015-11-13 DIAGNOSIS — O09519 Supervision of elderly primigravida, unspecified trimester: Secondary | ICD-10-CM | POA: Diagnosis not present

## 2015-11-13 DIAGNOSIS — Z23 Encounter for immunization: Secondary | ICD-10-CM | POA: Diagnosis not present

## 2015-11-18 ENCOUNTER — Inpatient Hospital Stay (HOSPITAL_COMMUNITY): Admission: RE | Admit: 2015-11-18 | Payer: No Typology Code available for payment source | Source: Ambulatory Visit

## 2015-11-19 ENCOUNTER — Telehealth (HOSPITAL_COMMUNITY): Payer: Self-pay | Admitting: *Deleted

## 2015-11-19 DIAGNOSIS — O09513 Supervision of elderly primigravida, third trimester: Secondary | ICD-10-CM | POA: Diagnosis not present

## 2015-11-19 NOTE — Telephone Encounter (Signed)
Preadmission screen  

## 2015-11-23 ENCOUNTER — Inpatient Hospital Stay (HOSPITAL_COMMUNITY): Payer: 59 | Admitting: Anesthesiology

## 2015-11-23 ENCOUNTER — Inpatient Hospital Stay (HOSPITAL_COMMUNITY)
Admission: RE | Admit: 2015-11-23 | Discharge: 2015-11-25 | DRG: 775 | Disposition: A | Discharge status: Home / Self Care | Payer: 59 | Source: Ambulatory Visit | Attending: Obstetrics and Gynecology | Admitting: Obstetrics and Gynecology

## 2015-11-23 ENCOUNTER — Encounter (HOSPITAL_COMMUNITY): Payer: Self-pay

## 2015-11-23 DIAGNOSIS — Z3490 Encounter for supervision of normal pregnancy, unspecified, unspecified trimester: Secondary | ICD-10-CM

## 2015-11-23 DIAGNOSIS — O480 Post-term pregnancy: Secondary | ICD-10-CM | POA: Diagnosis present

## 2015-11-23 DIAGNOSIS — Z3A40 40 weeks gestation of pregnancy: Secondary | ICD-10-CM

## 2015-11-23 DIAGNOSIS — O34219 Maternal care for unspecified type scar from previous cesarean delivery: Secondary | ICD-10-CM | POA: Diagnosis not present

## 2015-11-23 DIAGNOSIS — O34211 Maternal care for low transverse scar from previous cesarean delivery: Secondary | ICD-10-CM | POA: Diagnosis present

## 2015-11-23 LAB — CBC
HCT: 37.8 % (ref 36.0–46.0)
HEMATOCRIT: 37.3 % (ref 36.0–46.0)
HEMOGLOBIN: 12.9 g/dL (ref 12.0–15.0)
HEMOGLOBIN: 12.5 g/dL (ref 12.0–15.0)
MCH: 29.5 pg (ref 26.0–34.0)
MCH: 29.1 pg (ref 26.0–34.0)
MCHC: 34.1 g/dL (ref 30.0–36.0)
MCHC: 33.5 g/dL (ref 30.0–36.0)
MCV: 86.5 fL (ref 78.0–100.0)
MCV: 86.7 fL (ref 78.0–100.0)
PLATELETS: ADEQUATE 10*3/uL (ref 150–400)
Platelets: 251 10*3/uL (ref 150–400)
RBC: 4.37 MIL/uL (ref 3.87–5.11)
RBC: 4.3 MIL/uL (ref 3.87–5.11)
RDW: 14.4 % (ref 11.5–15.5)
RDW: 14.3 % (ref 11.5–15.5)
WBC: 7.1 10*3/uL (ref 4.0–10.5)
WBC: 8.1 10*3/uL (ref 4.0–10.5)

## 2015-11-23 LAB — TYPE AND SCREEN
ABO/RH(D): AB POS
ANTIBODY SCREEN: NEGATIVE

## 2015-11-23 LAB — ABO/RH: ABO/RH(D): AB POS

## 2015-11-23 LAB — RPR: RPR Ser Ql: NONREACTIVE

## 2015-11-23 MED ORDER — OXYCODONE-ACETAMINOPHEN 5-325 MG PO TABS: 1.0000 | ORAL_TABLET | ORAL | Status: DC | PRN

## 2015-11-23 MED ORDER — TERBUTALINE SULFATE 1 MG/ML IJ SOLN
0.2500 mg | Freq: Once | INTRAMUSCULAR | Status: DC | PRN
  Filled 2015-11-23: qty 1

## 2015-11-23 MED ORDER — OXYTOCIN 40 UNITS IN LACTATED RINGERS INFUSION - SIMPLE MED
1.0000 m[IU]/min | INTRAVENOUS | Status: DC
  Administered 2015-11-23: 3 m[IU]/min via INTRAVENOUS
  Administered 2015-11-23: 1 m[IU]/min via INTRAVENOUS
  Administered 2015-11-23: 4 m[IU]/min via INTRAVENOUS
  Administered 2015-11-24: 30 m[IU]/min via INTRAVENOUS
  Filled 2015-11-23 (×2): qty 1000

## 2015-11-23 MED ORDER — FLEET ENEMA 7-19 GM/118ML RE ENEM: 1.0000 | ENEMA | RECTAL | Status: DC | PRN

## 2015-11-23 MED ORDER — EPHEDRINE 5 MG/ML INJ
10.0000 mg | INTRAVENOUS | Status: DC | PRN
  Filled 2015-11-23: qty 4

## 2015-11-23 MED ORDER — LACTATED RINGERS IV SOLN
INTRAVENOUS | Status: DC
  Administered 2015-11-23 – 2015-11-24 (×3): via INTRAVENOUS

## 2015-11-23 MED ORDER — LACTATED RINGERS IV SOLN
500.0000 mL | INTRAVENOUS | Status: DC | PRN
Start: 2015-11-23 — End: 2015-11-24
  Administered 2015-11-24: 500 mL via INTRAVENOUS

## 2015-11-23 MED ORDER — DEXTROSE 5 % IV SOLN
5.0000 10*6.[IU] | Freq: Once | INTRAVENOUS | Status: AC
  Administered 2015-11-23: 5 10*6.[IU] via INTRAVENOUS
  Filled 2015-11-23: qty 5

## 2015-11-23 MED ORDER — DIPHENHYDRAMINE HCL 50 MG/ML IJ SOLN: 12.5000 mg | INTRAMUSCULAR | Status: DC | PRN

## 2015-11-23 MED ORDER — OXYTOCIN 40 UNITS IN LACTATED RINGERS INFUSION - SIMPLE MED: 2.5000 [IU]/h | INTRAVENOUS | Status: DC

## 2015-11-23 MED ORDER — PHENYLEPHRINE 40 MCG/ML (10ML) SYRINGE FOR IV PUSH (FOR BLOOD PRESSURE SUPPORT)
80.0000 ug | PREFILLED_SYRINGE | INTRAVENOUS | Status: DC | PRN
  Filled 2015-11-23: qty 5

## 2015-11-23 MED ORDER — OXYTOCIN BOLUS FROM INFUSION
500.0000 mL | Freq: Once | INTRAVENOUS | Status: AC
  Administered 2015-11-24: 500 mL/h via INTRAVENOUS

## 2015-11-23 MED ORDER — LIDOCAINE HCL (PF) 1 % IJ SOLN
INTRAMUSCULAR | Status: DC | PRN
  Administered 2015-11-23 (×2): 6 mL

## 2015-11-23 MED ORDER — LIDOCAINE HCL (PF) 1 % IJ SOLN
30.0000 mL | INTRAMUSCULAR | Status: DC | PRN
  Administered 2015-11-24: 30 mL via SUBCUTANEOUS
  Filled 2015-11-23: qty 30

## 2015-11-23 MED ORDER — SOD CITRATE-CITRIC ACID 500-334 MG/5ML PO SOLN: 30.0000 mL | ORAL | Status: DC | PRN

## 2015-11-23 MED ORDER — LACTATED RINGERS IV SOLN
500.0000 mL | Freq: Once | INTRAVENOUS | Status: AC
  Administered 2015-11-23: 500 mL via INTRAVENOUS

## 2015-11-23 MED ORDER — PENICILLIN G POTASSIUM 5000000 UNITS IJ SOLR
2.5000 10*6.[IU] | INTRAVENOUS | Status: DC
  Administered 2015-11-23 – 2015-11-24 (×7): 2.5 10*6.[IU] via INTRAVENOUS
  Filled 2015-11-23 (×11): qty 2.5

## 2015-11-23 MED ORDER — ONDANSETRON HCL 4 MG/2ML IJ SOLN
4.0000 mg | Freq: Four times a day (QID) | INTRAMUSCULAR | Status: DC | PRN
  Administered 2015-11-24 (×2): 4 mg via INTRAVENOUS
  Filled 2015-11-23 (×2): qty 2

## 2015-11-23 MED ORDER — PHENYLEPHRINE 40 MCG/ML (10ML) SYRINGE FOR IV PUSH (FOR BLOOD PRESSURE SUPPORT)
80.0000 ug | PREFILLED_SYRINGE | INTRAVENOUS | Status: DC | PRN
  Filled 2015-11-23: qty 10
  Filled 2015-11-23: qty 5

## 2015-11-23 MED ORDER — OXYCODONE-ACETAMINOPHEN 5-325 MG PO TABS: 2.0000 | ORAL_TABLET | ORAL | Status: DC | PRN

## 2015-11-23 MED ORDER — FENTANYL CITRATE (PF) 100 MCG/2ML IJ SOLN: 100.0000 ug | Freq: Once | INTRAMUSCULAR | Status: DC | PRN

## 2015-11-23 MED ORDER — FENTANYL 2.5 MCG/ML BUPIVACAINE 1/10 % EPIDURAL INFUSION (WH - ANES)
14.0000 mL/h | INTRAMUSCULAR | Status: DC | PRN
  Administered 2015-11-23 – 2015-11-24 (×2): 14 mL/h via EPIDURAL
  Filled 2015-11-23 (×2): qty 125

## 2015-11-23 MED ORDER — ACETAMINOPHEN 325 MG PO TABS
650.0000 mg | ORAL_TABLET | ORAL | Status: DC | PRN
  Administered 2015-11-24: 650 mg via ORAL
  Filled 2015-11-23: qty 2

## 2015-11-23 MED ORDER — LACTATED RINGERS IV SOLN: 500.0000 mL | Freq: Once | INTRAVENOUS | Status: DC

## 2015-11-23 NOTE — Anesthesia Procedure Notes (Signed)
Epidural Patient location during procedure: OB Start time: 11/23/2015 11:15 PM  Staffing Anesthesiologist: Franne Grip  Preanesthetic Checklist Completed: patient identified, site marked, surgical consent, pre-op evaluation, timeout performed, IV checked, risks and benefits discussed and monitors and equipment checked  Epidural Patient position: sitting Prep: DuraPrep Patient monitoring: blood pressure and heart rate Approach: midline Location: L3-L4 Injection technique: LOR saline  Needle:  Needle type: Tuohy  Needle gauge: 17 G Needle length: 9 cm Needle insertion depth: 6 cm Catheter type: closed end flexible Catheter size: 19 Gauge Catheter at skin depth: 14 cm Test dose: negative and Other  Assessment Events: blood not aspirated, injection not painful, no injection resistance, negative IV test and no paresthesia  Additional Notes Reason for block:procedure for pain

## 2015-11-23 NOTE — Anesthesia Pain Management Evaluation Note (Signed)
  CRNA Pain Management Visit Note  Patient: Sandra Roberts, 35 y.o., female  "Hello I am a member of the anesthesia team at [facility]. We have an anesthesia team available at all times to provide care throughout the hospital, including epidural management and anesthesia for C-section. I don't know your plan for the delivery whether it a natural birth, water birth, IV sedation, nitrous supplementation, doula or epidural, but we want to meet your pain goals."   1.Was your pain managed to your expectations on prior hospitalizations?     2.What is your expectation for pain management during this hospitalization?      3.How can we help you reach that goal?   Record the patient's initial score and the patient's pain goal.   Pain: 0  Pain Goal: 5 [facility] wants you to be able to say your pain was always managed very well.  [name] 11/23/2015

## 2015-11-23 NOTE — Anesthesia Preprocedure Evaluation (Signed)
Anesthesia Evaluation  Patient identified by MRN, date of birth, ID band Patient awake    Reviewed: Allergy & Precautions, NPO status , Patient's Chart, lab work & pertinent test results  Airway Mallampati: II  TM Distance: >3 FB Neck ROM: Full    Dental no notable dental hx.    Pulmonary neg pulmonary ROS,    Pulmonary exam normal breath sounds clear to auscultation       Cardiovascular negative cardio ROS Normal cardiovascular exam Rhythm:Regular Rate:Normal     Neuro/Psych negative neurological ROS  negative psych ROS   GI/Hepatic negative GI ROS, Neg liver ROS,   Endo/Other  negative endocrine ROS  Renal/GU negative Renal ROS  negative genitourinary   Musculoskeletal negative musculoskeletal ROS (+)   Abdominal (+) + obese,   Peds negative pediatric ROS (+)  Hematology negative hematology ROS (+)   Anesthesia Other Findings   Reproductive/Obstetrics negative OB ROS                             Anesthesia Physical Anesthesia Plan  ASA: II  Anesthesia Plan: Epidural   Post-op Pain Management:    Induction: Intravenous  Airway Management Planned: Natural Airway  Additional Equipment:   Intra-op Plan:   Post-operative Plan:   Informed Consent: I have reviewed the patients History and Physical, chart, labs and discussed the procedure including the risks, benefits and alternatives for the proposed anesthesia with the patient or authorized representative who has indicated his/her understanding and acceptance.     Plan Discussed with: CRNA  Anesthesia Plan Comments: (Informed consent obtained prior to proceeding including risk of failure, 1% risk of PDPH, risk of minor discomfort and bruising.  Discussed rare but serious complications including epidural abscess, permanent nerve injury, epidural hematoma.  Discussed alternatives to epidural analgesia and patient desires to  proceed.  Timeout performed pre-procedure verifying patient name, procedure, and platelet count.  Patient tolerated procedure well.)        Anesthesia Quick Evaluation

## 2015-11-24 ENCOUNTER — Encounter (HOSPITAL_COMMUNITY): Payer: Self-pay | Admitting: *Deleted

## 2015-11-24 ENCOUNTER — Inpatient Hospital Stay (HOSPITAL_COMMUNITY)
Admission: RE | Admit: 2015-11-24 | Payer: No Typology Code available for payment source | Source: Ambulatory Visit | Attending: Obstetrics and Gynecology | Admitting: Obstetrics and Gynecology

## 2015-11-24 DIAGNOSIS — Z3490 Encounter for supervision of normal pregnancy, unspecified, unspecified trimester: Secondary | ICD-10-CM

## 2015-11-24 MED ORDER — DIBUCAINE 1 % RE OINT: 1.0000 "application " | TOPICAL_OINTMENT | RECTAL | Status: DC | PRN

## 2015-11-24 MED ORDER — ZOLPIDEM TARTRATE 5 MG PO TABS: 5.0000 mg | ORAL_TABLET | Freq: Every evening | ORAL | Status: DC | PRN

## 2015-11-24 MED ORDER — COCONUT OIL OIL: 1.0000 "application " | TOPICAL_OIL | Status: DC | PRN

## 2015-11-24 MED ORDER — SENNOSIDES-DOCUSATE SODIUM 8.6-50 MG PO TABS
2.0000 | ORAL_TABLET | ORAL | Status: DC
  Administered 2015-11-24: 2 via ORAL
  Filled 2015-11-24: qty 2

## 2015-11-24 MED ORDER — SIMETHICONE 80 MG PO CHEW: 80.0000 mg | CHEWABLE_TABLET | ORAL | Status: DC | PRN

## 2015-11-24 MED ORDER — LACTATED RINGERS IV SOLN
INTRAVENOUS | Status: DC
  Administered 2015-11-24: 09:00:00 via INTRAUTERINE

## 2015-11-24 MED ORDER — ONDANSETRON HCL 4 MG/2ML IJ SOLN: 4.0000 mg | INTRAMUSCULAR | Status: DC | PRN

## 2015-11-24 MED ORDER — IBUPROFEN 600 MG PO TABS
600.0000 mg | ORAL_TABLET | Freq: Four times a day (QID) | ORAL | Status: DC
  Administered 2015-11-24 – 2015-11-25 (×3): 600 mg via ORAL
  Filled 2015-11-24 (×4): qty 1

## 2015-11-24 MED ORDER — ONDANSETRON HCL 4 MG PO TABS: 4.0000 mg | ORAL_TABLET | ORAL | Status: DC | PRN

## 2015-11-24 MED ORDER — BENZOCAINE-MENTHOL 20-0.5 % EX AERO: 1.0000 "application " | INHALATION_SPRAY | CUTANEOUS | Status: DC | PRN

## 2015-11-24 MED ORDER — MEASLES, MUMPS & RUBELLA VAC ~~LOC~~ INJ
0.5000 mL | INJECTION | Freq: Once | SUBCUTANEOUS | Status: DC
  Filled 2015-11-24: qty 0.5

## 2015-11-24 MED ORDER — TETANUS-DIPHTH-ACELL PERTUSSIS 5-2.5-18.5 LF-MCG/0.5 IM SUSP: 0.5000 mL | Freq: Once | INTRAMUSCULAR | Status: DC

## 2015-11-24 MED ORDER — WITCH HAZEL-GLYCERIN EX PADS: 1.0000 "application " | MEDICATED_PAD | CUTANEOUS | Status: DC | PRN

## 2015-11-24 MED ORDER — ACETAMINOPHEN 325 MG PO TABS: 650.0000 mg | ORAL_TABLET | ORAL | Status: DC | PRN

## 2015-11-24 NOTE — Anesthesia Rounding Note (Signed)
  CRNA Epidural Rounding Note  Patient: Sandra Roberts, 35 y.o., female  Patient's current pain level: Pain Score: 0-No pain (11/24/15 0231)  Agreed upon pain management level: Patient sleeping - unable to assess  Epidural intervention: No   Comments:   [facility] 11/24/2015

## 2015-11-24 NOTE — Lactation Note (Signed)
This note was copied from a baby's chart. Lactation Consultation Note  Patient Name: Sandra Roberts [id number] Date: 11/24/2015 Reason for consult: Initial assessment Baby 65 hours old. Mom reports that she attempted to nurse first child, 37 years old, but he "would not latch." Mom reports that she is able to get baby latched to left breast, but is having trouble latching to right breast. Assisted with latching baby to right breast is cross-cradle position. After several attempts and demonstrating to mom how to sandwich her breast, baby latched to right breast deeply. Baby suckled rhythmically with a few swallows noted. Discussed progression of milk coming to volume and supply and demand. Discussed with mom how to get her UMR pump from the store, and how to nurse/pump and return to work.   Mom given [facility] brochure, aware of OP/BFSG and [place] phone line assistance after D/C.   Maternal Data Has patient been taught Hand Expression?: Yes Does the patient have breastfeeding experience prior to this delivery?: Yes  Feeding Feeding Type: Breast Fed Length of feed: 30 min  LATCH Score/Interventions Latch: Repeated attempts needed to sustain latch, nipple held in mouth throughout feeding, stimulation needed to elicit sucking reflex. Intervention(s): Adjust position;Assist with latch;Breast compression  Audible Swallowing: A few with stimulation Intervention(s): Skin to skin;Hand expression  Type of Nipple: Everted at rest and after stimulation (right nipple short shaft)  Comfort (Breast/Nipple): Soft / non-tender     Hold (Positioning): Assistance needed to correctly position infant at breast and maintain latch. Intervention(s): Breastfeeding basics reviewed;Support Pillows;Position options;Skin to skin  LATCH Score: 7  Lactation Tools Discussed/Used     Consult Status Consult Status: Follow-up Date: 11/25/15 Follow-up type: In-patient    Andres Labrum 11/24/2015, 11:15  PM

## 2015-11-24 NOTE — Anesthesia Postprocedure Evaluation (Signed)
Anesthesia Post Note  Patient: Sandra Roberts  Procedure(s) Performed: * No procedures listed *  Patient location during evaluation: Mother Baby Anesthesia Type: Epidural Level of consciousness: awake and alert and oriented Pain management: satisfactory to patient Vital Signs Assessment: post-procedure vital signs reviewed and stable Respiratory status: spontaneous breathing and nonlabored ventilation Cardiovascular status: stable Postop Assessment: no headache, no backache, no signs of nausea or vomiting, adequate PO intake and patient able to bend at knees (patient up walking) Anesthetic complications: no     Last Vitals:  Vitals:   11/24/15 1345 11/24/15 1445  BP: 113/66 125/73  Pulse: (!) 113 100  Resp: 20 20  Temp: 37.1 C 36.6 C    Last Pain:  Vitals:   11/24/15 1445  TempSrc: Oral  PainSc:    Pain Goal: Patients Stated Pain Goal: 2 (11/23/15 2244)               Willa Rough

## 2015-11-25 LAB — CBC
HCT: 32.7 % — ABNORMAL LOW (ref 36.0–46.0)
Hemoglobin: 11.1 g/dL — ABNORMAL LOW (ref 12.0–15.0)
MCH: 29.6 pg (ref 26.0–34.0)
MCHC: 33.9 g/dL (ref 30.0–36.0)
MCV: 87.2 fL (ref 78.0–100.0)
PLATELETS: 221 10*3/uL (ref 150–400)
RBC: 3.75 MIL/uL — AB (ref 3.87–5.11)
RDW: 14.7 % (ref 11.5–15.5)
WBC: 18 10*3/uL — AB (ref 4.0–10.5)

## 2015-11-25 NOTE — Plan of Care (Signed)
Problem: Tissue Perfusion: Goal: Risk factors for ineffective tissue perfusion will decrease No sign of blood clots.  PAS hose initiated on admission

## 2015-11-25 NOTE — Discharge Summary (Signed)
Obstetric Discharge Summary Reason for Admission: induction of labor Prenatal Procedures: NST and ultrasound Intrapartum Procedures: vacuum Postpartum Procedures: none Complications-Operative and Postpartum: none Hemoglobin  Date Value Ref Range Status  11/25/2015 11.1 (L) 12.0 - 15.0 g/dL Final   HCT  Date Value Ref Range Status  11/25/2015 32.7 (L) 36.0 - 46.0 % Final    Physical Exam:  General: alert and cooperative Lochia: appropriate Uterine Fundus: firm   Discharge Diagnoses: AMA and history of prior C/S and variable decels  Discharge Information: Date: 11/25/2015 Activity: pelvic rest Diet: routine Medications: PNV and Ibuprofen Condition: stable Instructions: refer to practice specific booklet Discharge to: home   Newborn Data: Live born female  Birth Weight: 7 lb 2.3 oz (3240 g) APGAR: 8, 9  Home with mother.  ANDERSON,MARK E 11/25/2015, 11:32 AM

## 2015-11-25 NOTE — Lactation Note (Signed)
This note was copied from a baby's chart. Lactation Consultation Note  Baby recently bf for 20 min.  Parents deny problems. Mom encouraged to feed baby 8-12 times/24 hours and with feeding cues.  Discussed pumping and going back to work. Provided family with UMR pump. Reviewed engorgement care and monitoring voids/stools.   Patient Name: Sandra Roberts [id number] Date: 11/25/2015 Reason for consult: Follow-up assessment   Maternal Data    Feeding Feeding Type: Breast Fed Length of feed: 30 min  LATCH Score/Interventions                      Lactation Tools Discussed/Used     Consult Status Consult Status: Complete    Carlye Grippe 11/25/2015, 2:50 PM

## 2015-11-25 NOTE — Progress Notes (Signed)
PPD#1 Pt has no complaints. She would like to go home today. Lochia-wnl VSSAF IMP/stable Plan/ Will discharge

## 2015-12-04 ENCOUNTER — Ambulatory Visit (HOSPITAL_COMMUNITY)
Admission: RE | Admit: 2015-12-04 | Discharge: 2015-12-04 | Disposition: A | Discharge status: Home / Self Care | Payer: 59 | Source: Ambulatory Visit | Attending: Obstetrics and Gynecology | Admitting: Obstetrics and Gynecology

## 2015-12-04 ENCOUNTER — Telehealth (HOSPITAL_COMMUNITY): Payer: Self-pay | Admitting: Lactation Services

## 2015-12-04 NOTE — Telephone Encounter (Signed)
Baby is 63 days old. Mother called with questions regarding her milk supply.  She started supplementing with formula due to weight loss and jaundice.  She is breastfeeding baby, supplementing with formula and pumping approx 2 oz every 2 hours.  Infant's last weight was 6 lb 11 oz. Birth weight was 7 lb 2 oz.  Recommend she do power pumping session to give her milk supply a boost and hand express before and after pumping.  Also suggested she make OP appointment to check infant's latch.  Mother states she will call to make OP at a later date.  Will visit Pediatrician today.

## 2015-12-04 NOTE — Lactation Note (Signed)
Lactation Consult  Mother's reason for visit:  Requesting a NS, slow infant weight gain Visit Type:  Feeding Assessment Appointment Notes: Mom in requesting NS due to trouble latching, mom with compressible breasts and areola. Mom was able to latch infant easily on both breasts. Mom is noted to have a decreased supply and infant is sleepy at breast and transferred minimal milk for day of age. Used 5 fr feeding tube at breast with formula and infant got into a more rhythmic pattern and stayed at the breast longer than she usually does per mom. Mom is pumping and has noted an increase in supply. Lactation Cookie Recipe and information on increasing milk supply, Fenugreek, and Mother Love More milk Plus with instructions to call OB to inquire about herbs before taking. Advised mom to continue supplement and pumping. Follow up [place] apt 12/11/15 @ 1 pm. See plan at bottom of page. Consult:  Initial Lactation Consultant:  Carlye Grippe and Nonah Mattes RN [place]  ________________________________________________________________________ Sandra Roberts Name:  Sandra Roberts Date of Birth:  11/24/2017 Pediatrician:  Marciano Sequin Gender:  female Gestational Age: [redacted]w[redacted]d (At Birth) Birth Weight:  7 lb 2.3 oz (3240 g) Weight at Discharge:  Weight: 7 lb 1.8 oz (3225 g)               Date of Discharge:  11/25/2015     Filed Weights   11/24/15 1105 11/25/15 0000  Weight: 7 lb 2.3 oz (3240 g) 7 lb 1.8 oz (3225 g)  Last weight taken from location outside of [facility]:  6 lb 15 oz per mom    Location:Pediatrician's office Weight today:  7 lb 1.5 oz (3218 grams)      ________________________________________________________________________  Mother's Name: Sandra Roberts Type of delivery: Vaginal  Breastfeeding Experience: having difficultly latching infant, asking for a NS Maternal Medical Conditions:   Maternal Medications:     ________________________________________________________________________  Breastfeeding History (Post Discharge)  Frequency of breastfeeding:  Every 2-3 hours Duration of feeding:  5 minutes  Supplementation  Formula:  Volume 38ml Frequency:  2-3 hours  Total volume per day:  8 oz       Brand: Unknown  Breastmilk:  Volume 30 ml Frequency:  2-3 hours Total volume per day:  8 oz  Method:  Bottle,   Pumping  Type of pump:  Medela pump in style Frequency:  Every 2-3 hours Volume:  60 ml    Infant Intake and Output Assessment  Voids:  13 in 24 hrs.  Color:  Clear yellow Stools:  4 in 24 hrs.  Color:  Yellow  ________________________________________________________________________  Maternal Breast Assessment  Breast:  Soft Nipple:  Erect Pain level:  0  _______________________________________________________________________ Feeding Assessment/Evaluation   Initial feeding assessment:  Infant's oral assessment:  WNL  Positioning:  Cross cradle Right breast  LATCH documentation:  Latch:  1 = Repeated attempts needed to sustain latch, nipple held in mouth throughout feeding, stimulation needed to elicit sucking reflex.  Audible swallowing:  1 = A few with stimulation  Type of nipple:  2 = Everted at rest and after stimulation  Comfort (Breast/Nipple):  2 = Soft / non-tender  Hold (Positioning):  2 = No assistance needed to correctly position infant at breast  LATCH score:  8  Attached assessment:  Deep  Lips flanged:  Yes.    Lips untucked:  No.  Suck assessment:  Displays both   Pre-feed weight:  3218 g  (7 lb. 1.5 oz.) Post-feed  weight:  3236 g (7 lb. 202 oz.) Amount transferred:  18 ml Amount supplemented:  0 ml  Additional Feeding Assessment -   Infant's oral assessment:  WNL  Positioning:  Cross cradle Left breast  LATCH documentation:  Latch:  1 = Repeated attempts needed to sustain latch, nipple held in mouth throughout feeding, stimulation  needed to elicit sucking reflex.  Audible swallowing:  1 = A few with stimulation  Type of nipple:  2 = Everted at rest and after stimulation  Comfort (Breast/Nipple):  2 = Soft / non-tender  Hold (Positioning):  2 = No assistance needed to correctly position infant at breast  LATCH score:  8   Attached assessment:  Deep  Lips flanged:  Yes.    Lips untucked:  No.  Suck assessment:  Displays both  Tools:  Syringe with 5 Fr feeding tube Instructed on use and cleaning of tool:  Yes.    Pre-feed weight:  3236g  (7 lb. 2.2 oz.) Post-feed weight:  3262 g (7 lb. 3 oz.) Amount transferred:  26 ml Amount supplemented:  16 ml    Total amount transferred:  30 ml Total supplement given:  16 ml  Plan: Breastfeed infant at breat every 2-3 hours at first feeding cues Use 5 fr feeding tube at breast if desired Awaken infant as needed with feeding If not using 5 fr feeding tube at breast, supplement infant with bottle after BF with breast milk or formula of at least 30-40 cc/feeding Pump every 2-3 hours for 15-20 minutes or for about 2 minutes after milk flow stops Hand express post pumping Sleep when infant sleeps Keep up the awesome work Outpatient appt Tuesday December 11, 2015

## 2015-12-11 ENCOUNTER — Ambulatory Visit (HOSPITAL_COMMUNITY)
Admission: RE | Admit: 2015-12-11 | Discharge: 2015-12-11 | Disposition: A | Discharge status: Home / Self Care | Payer: 59 | Source: Ambulatory Visit | Attending: Obstetrics and Gynecology | Admitting: Obstetrics and Gynecology

## 2015-12-11 NOTE — Lactation Note (Signed)
Lactation Consult  Mother's reason for visit:   Visit Type:  OP Appointment Notes:  Pt is here today with Sandra Roberts to follow-up for slow weight gain.  She has been BF 3-4 times a day for 20 minutes and receiving about 6-7 2 oz bottles of formula or expressed breast milk. Today [name] attached to the left breast in a cross cradle hold.Mom was taught breast compression to keep her engaged.  She slowed down after 5 minutes but stayed on for 10 minutes. The transfer was 22 ml. Suck assessment was done and she was not pulling a gloved finger deeply into her mouth.  Tongue exercises were performed in an attempt to have carlie pull the finger back to the hard and soft palate juncture.  This was successful. Mother reported that baby latched deeper after this however transfer was only 13ml. Repositioned on the same side and an additional 18 was transferred. Lastly she was placed back on the first side for an additional 4 ml. Total transfer was 1.9 oz. SHe also stayed awake during the feeding and after the feeding. Mom reported that this feeding was better and that [name] was more engaged.  Plan: Work on positioning and deep latch Tongue exercises Breast compressions Feed for 40 minutes using breast compression and switching sides during the feeding Attend support group Post pump 4 times in 24 hours Make sure a good BF or pumping happens 8-10 times in 24 hours If necessary or desired ok to bottle feed expressed breast milk or formula  Consult:  Follow-Up Lactation Consultant:  Van Clines  ________________________________________________________________________ Sandra Roberts Name:  Sandra Roberts Date of Birth:  11/24/2017 Pediatrician:  Sabra Heck Gender:  female Gestational Age: [redacted]w[redacted]d (At Birth) Birth Weight:  7 lb 2.3 oz (3240 g) Weight at Discharge:  Weight: 7 lb 1.8 oz (3225 g)               Date of Discharge:  11/25/2015     Filed Weights   11/24/15 1105 11/25/15 0000  Weight: 7 lb 2.3 oz  (3240 g) 7 lb 1.8 oz (3225 g)   Weight today:  7#10.9 oz    ________________________________________________________________________  Mother's Name: Sandra Roberts   Breastfeeding Experience:  First baby _____________________________________________________   Voids:  12 in 24 hrs.  Color:  Clear yellow Stools:  4 in 24 hrs.  Color:  Yellow    _______________________________________________________________________

## 2015-12-24 MED FILL — METOCLOPRAMIDE 10 MG TABLET: 10 | 30 days supply | Qty: 120 | Fill #0

## 2015-12-28 MED FILL — VALACYCLOVIR HCL 500 MG TAB: 500 | 30 days supply | Qty: 30 | Fill #0

## 2016-05-13 DIAGNOSIS — Z124 Encounter for screening for malignant neoplasm of cervix: Secondary | ICD-10-CM | POA: Diagnosis not present

## 2016-05-13 DIAGNOSIS — Z01419 Encounter for gynecological examination (general) (routine) without abnormal findings: Secondary | ICD-10-CM | POA: Diagnosis not present

## 2016-05-13 DIAGNOSIS — Z6833 Body mass index (BMI) 33.0-33.9, adult: Secondary | ICD-10-CM | POA: Diagnosis not present

## 2016-07-02 DIAGNOSIS — H52223 Regular astigmatism, bilateral: Secondary | ICD-10-CM | POA: Diagnosis not present

## 2016-07-02 DIAGNOSIS — H5213 Myopia, bilateral: Secondary | ICD-10-CM | POA: Diagnosis not present

## 2016-09-08 ENCOUNTER — Telehealth: Payer: 59 | Admitting: Nurse Practitioner

## 2016-09-08 DIAGNOSIS — R21 Rash and other nonspecific skin eruption: Secondary | ICD-10-CM | POA: Diagnosis not present

## 2016-09-08 MED ORDER — PREDNISONE 10 MG (21) PO TBPK: ORAL_TABLET | ORAL | 0 refills | Status: AC

## 2016-09-08 MED ORDER — TRIAMCINOLONE ACETONIDE 0.1 % EX CREA: 1.0000 "application " | TOPICAL_CREAM | Freq: Two times a day (BID) | CUTANEOUS | 0 refills | Status: AC

## 2016-09-08 MED FILL — predniSONE 10 MG TABS: 10 | 6 days supply | Qty: 21 | Fill #0

## 2016-09-08 MED FILL — TRIAMCINOLONE ACETONIDE 0.1: 0.1 | 10 days supply | Qty: 30 | Fill #0

## 2016-09-08 NOTE — Addendum Note (Signed)
Addended by: Cari Caraway on: 09/08/2016 12:33 PM   Modules accepted: Orders

## 2016-09-08 NOTE — Addendum Note (Signed)
Addended by: Cari Caraway on: 09/08/2016 12:40 PM   Modules accepted: Orders

## 2016-09-08 NOTE — Progress Notes (Signed)
E Visit for Rash  We are sorry that you are not feeling well. Here is how we plan to help!   Based upon your presentation it appears you have a fungal infection.  I have prescribed: and Nystatin cream apply to the affected area twice daily Sorry my computer cut off in the middle of my statement. Again thanks for picture, but unfortunately it is difficult to tell exactly what is going on. Since nystatin cream was helping , I suggest you continue that for now. Keep ares as dry as possible and avoid scratching. If it is not improving you will need a face to face visit.   HOME CARE:   Take cool showers and avoid direct sunlight.  Apply cool compress or wet dressings.  Take a bath in an oatmeal bath.  Sprinkle content of one Aveeno packet under running faucet with comfortably warm water.  Bathe for 15-20 minutes, 1-2 times daily.  Pat dry with a towel. Do not rub the rash.  Use hydrocortisone cream.  Take an antihistamine like Benadryl for widespread rashes that itch.  The adult dose of Benadryl is 25-50 mg by mouth 4 times daily.  Caution:  This type of medication may cause sleepiness.  Do not drink alcohol, drive, or operate dangerous machinery while taking antihistamines.  Do not take these medications if you have prostate enlargement.  Read package instructions thoroughly on all medications that you take.  GET HELP RIGHT AWAY IF:   Symptoms don't go away after treatment.  Severe itching that persists.  If you rash spreads or swells.  If you rash begins to smell.  If it blisters and opens or develops a yellow-brown crust.  You develop a fever.  You have a sore throat.  You become short of breath.  MAKE SURE YOU:  Understand these instructions. Will watch your condition. Will get help right away if you are not doing well or get worse.  Thank you for choosing an e-visit. Your e-visit answers were reviewed by a board certified advanced clinical practitioner to complete  your personal care plan. Depending upon the condition, your plan could have included both over the counter or prescription medications. Please review your pharmacy choice. Be sure that the pharmacy you have chosen is open so that you can pick up your prescription now.  If there is a problem you may message your provider in [place] to have the prescription routed to another pharmacy. Your safety is important to [place]. If you have drug allergies check your prescription carefully.  For the next 24 hours, you can use MyChart to ask questions about today's visit, request a non-urgent call back, or ask for a work or school excuse from your e-visit provider. You will get an email in the next two days asking about your experience. I hope that your e-visit has been valuable and will speed your recovery.

## 2016-10-08 DIAGNOSIS — N760 Acute vaginitis: Secondary | ICD-10-CM | POA: Diagnosis not present

## 2016-10-08 MED FILL — FLUCONAZOLE 150 MG TABLET: 150 | 2 days supply | Qty: 2 | Fill #0

## 2016-10-30 DIAGNOSIS — R040 Epistaxis: Secondary | ICD-10-CM | POA: Diagnosis not present

## 2016-10-31 DIAGNOSIS — Z7289 Other problems related to lifestyle: Secondary | ICD-10-CM | POA: Diagnosis not present

## 2016-10-31 DIAGNOSIS — R040 Epistaxis: Secondary | ICD-10-CM | POA: Diagnosis not present

## 2016-11-19 DIAGNOSIS — D239 Other benign neoplasm of skin, unspecified: Secondary | ICD-10-CM | POA: Diagnosis not present

## 2016-11-19 DIAGNOSIS — L239 Allergic contact dermatitis, unspecified cause: Secondary | ICD-10-CM | POA: Diagnosis not present

## 2016-11-19 MED FILL — hydrOXYzine HCL 10 MG TABS: 10 | 30 days supply | Qty: 60 | Fill #0

## 2016-11-25 MED FILL — VALACYCLOVIR HCL 500 MG TAB: 500 | 30 days supply | Qty: 30 | Fill #1

## 2016-12-15 MED FILL — FLUCONAZOLE 150 MG TABLET: 150 | 2 days supply | Qty: 2 | Fill #0

## 2017-03-30 ENCOUNTER — Ambulatory Visit: Payer: Self-pay | Admitting: Emergency Medicine

## 2017-03-30 VITALS — BP 114/76 | HR 119 | Temp 99.6°F | Resp 19

## 2017-03-30 DIAGNOSIS — R52 Pain, unspecified: Secondary | ICD-10-CM

## 2017-03-30 DIAGNOSIS — N979 Female infertility, unspecified: Secondary | ICD-10-CM | POA: Insufficient documentation

## 2017-03-30 DIAGNOSIS — R6889 Other general symptoms and signs: Secondary | ICD-10-CM

## 2017-03-30 LAB — POCT INFLUENZA A/B
INFLUENZA A, POC: NEGATIVE
INFLUENZA B, POC: NEGATIVE

## 2017-03-30 MED ORDER — OSELTAMIVIR PHOSPHATE 75 MG PO CAPS: 75.0000 mg | ORAL_CAPSULE | Freq: Two times a day (BID) | ORAL | 0 refills | Status: DC

## 2017-03-30 MED FILL — OSELTAMIVIR PHOSPHATE 75 MG: 75 | 5 days supply | Qty: 10 | Fill #0

## 2017-03-30 NOTE — Patient Instructions (Signed)

## 2017-03-30 NOTE — Progress Notes (Signed)
S: Sandra Roberts is a 37 y.o. female who presents for fever, chills, myalgias, and fatigue. 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 She has received flu shot this year. Symptoms are helped with Tylenol. Has not had other OTC therapies. Denies cough, N/V/D. Sore throat. No Hx of asthma or chronic lung disease, LMP on 03/10/17.  Review of Systems  Constitutional: Positive for chills, fever and malaise/fatigue.  HENT: Positive for congestion. Negative for sinus pain and sore throat.   Respiratory: Negative for cough, shortness of breath and wheezing.   Cardiovascular: Negative for chest pain and palpitations.  Gastrointestinal: Negative for diarrhea, nausea and vomiting.  Musculoskeletal: Positive for myalgias.  Neurological: Negative.    O: Vitals:   03/30/17 1117  BP: 114/76  Pulse: (!) 119  Resp: 19  Temp: 99.6 F (37.6 C)  SpO2: 99%   Physical Exam  Constitutional: She is oriented to person, place, and time. She appears well-developed and well-nourished. No distress.  HENT:  Head: Normocephalic and atraumatic.  Right Ear: Tympanic membrane and external ear normal.  Left Ear: Tympanic membrane and external ear normal.  Nose: Nose normal.  Mouth/Throat: Uvula is midline and oropharynx is clear and moist.  Eyes: Conjunctivae are normal.  Neck: Normal range of motion. Neck supple.  Cardiovascular: Regular rhythm. Tachycardia present.  Pulmonary/Chest: Effort normal.  Lymphadenopathy:    She has no cervical adenopathy.  Neurological: She is alert and oriented to person, place, and time.  Skin: Skin is warm. Capillary refill takes less than 2 seconds. She is not diaphoretic.  Psychiatric: She has a normal mood and affect.  Vitals reviewed.   A: Viral illness, flu like symptoms  P: Known exposure to flu A, considering possibility of false negative on POCT. Provided counseling on risks/benefits of medication, pt requested Rx of Tamiflu.  Recommend rest, fluids, tylenol or motrin as needed, follow up as needed.

## 2017-04-20 ENCOUNTER — Ambulatory Visit (INDEPENDENT_AMBULATORY_CARE_PROVIDER_SITE_OTHER): Payer: No Typology Code available for payment source | Admitting: Family Medicine

## 2017-04-20 ENCOUNTER — Encounter: Payer: Self-pay | Admitting: Family Medicine

## 2017-04-20 VITALS — BP 100/60 | HR 82 | Temp 98.3°F | Ht 69.0 in | Wt 218.0 lb

## 2017-04-20 DIAGNOSIS — Z0000 Encounter for general adult medical examination without abnormal findings: Secondary | ICD-10-CM

## 2017-04-20 DIAGNOSIS — Z1329 Encounter for screening for other suspected endocrine disorder: Secondary | ICD-10-CM

## 2017-04-20 DIAGNOSIS — R5383 Other fatigue: Secondary | ICD-10-CM

## 2017-04-20 DIAGNOSIS — Z13220 Encounter for screening for lipoid disorders: Secondary | ICD-10-CM | POA: Diagnosis not present

## 2017-04-20 NOTE — Patient Instructions (Addendum)
Preventive Care 18-39 Years, Female Preventive care refers to lifestyle choices and visits with your health care provider that can promote health and wellness. What does preventive care include?  A yearly physical exam. This is also called an annual well check.  Dental exams once or twice a year.  Routine eye exams. Ask your health care provider how often you should have your eyes checked.  Personal lifestyle choices, including: ? Daily care of your teeth and gums. ? Regular physical activity. ? Eating a healthy diet. ? Avoiding tobacco and drug use. ? Limiting alcohol use. ? Practicing safe sex. ? Taking vitamin and mineral supplements as recommended by your health care provider. What happens during an annual well check? The services and screenings done by your health care provider during your annual well check will depend on your age, overall health, lifestyle risk factors, and family history of disease. Counseling Your health care provider may ask you questions about your:  Alcohol use.  Tobacco use.  Drug use.  Emotional well-being.  Home and relationship well-being.  Sexual activity.  Eating habits.  Work and work Statistician.  Method of birth control.  Menstrual cycle.  Pregnancy history.  Screening You may have the following tests or measurements:  Height, weight, and BMI.  Diabetes screening. This is done by checking your blood sugar (glucose) after you have not eaten for a while (fasting).  Blood pressure.  Lipid and cholesterol levels. These may be checked every 5 years starting at age 66.  Skin check.  Hepatitis C blood test.  Hepatitis B blood test.  Sexually transmitted disease (STD) testing.  BRCA-related cancer screening. This may be done if you have a family history of breast, ovarian, tubal, or peritoneal cancers.  Pelvic exam and Pap test. This may be done every 3 years starting at age 40. Starting at age 59, this may be done every 5  years if you have a Pap test in combination with an HPV test.  Discuss your test results, treatment options, and if necessary, the need for more tests with your health care provider. Vaccines Your health care provider may recommend certain vaccines, such as:  Influenza vaccine. This is recommended every year.  Tetanus, diphtheria, and acellular pertussis (Tdap, Td) vaccine. You may need a Td booster every 10 years.  Varicella vaccine. You may need this if you have not been vaccinated.  HPV vaccine. If you are 69 or younger, you may need three doses over 6 months.  Measles, mumps, and rubella (MMR) vaccine. You may need at least one dose of MMR. You may also need a second dose.  Pneumococcal 13-valent conjugate (PCV13) vaccine. You may need this if you have certain conditions and were not previously vaccinated.  Pneumococcal polysaccharide (PPSV23) vaccine. You may need one or two doses if you smoke cigarettes or if you have certain conditions.  Meningococcal vaccine. One dose is recommended if you are age 27-21 years and a first-year college student living in a residence hall, or if you have one of several medical conditions. You may also need additional booster doses.  Hepatitis A vaccine. You may need this if you have certain conditions or if you travel or work in places where you may be exposed to hepatitis A.  Hepatitis B vaccine. You may need this if you have certain conditions or if you travel or work in places where you may be exposed to hepatitis B.  Haemophilus influenzae type b (Hib) vaccine. You may need this if  you have certain risk factors.  Talk to your health care provider about which screenings and vaccines you need and how often you need them. This information is not intended to replace advice given to you by your health care provider. Make sure you discuss any questions you have with your health care provider. Document Released: 04/01/2001 Document Revised: 10/24/2015  Document Reviewed: 12/05/2014 Elsevier Interactive Patient Education  2018 [employer].  Fatigue Fatigue is feeling tired all of the time, a lack of energy, or a lack of motivation. Occasional or mild fatigue is often a normal response to activity or life in general. However, long-lasting (chronic) or extreme fatigue may indicate an underlying medical condition. Follow these instructions at home: Watch your fatigue for any changes. The following actions may help to lessen any discomfort you are feeling:  Talk to your health care provider about how much sleep you need each night. Try to get the required amount every night.  Take medicines only as directed by your health care provider.  Eat a healthy and nutritious diet. Ask your health care provider if you need help changing your diet.  Drink enough fluid to keep your urine clear or pale yellow.  Practice ways of relaxing, such as yoga, meditation, massage therapy, or acupuncture.  Exercise regularly.  Change situations that cause you stress. Try to keep your work and personal routine reasonable.  Do not abuse illegal drugs.  Limit alcohol intake to no more than 1 drink per day for nonpregnant women and 2 drinks per day for men. One drink equals 12 ounces of beer, 5 ounces of wine, or 1 ounces of hard liquor.  Take a multivitamin, if directed by your health care provider.  Contact a health care provider if:  Your fatigue does not get better.  You have a fever.  You have unintentional weight loss or gain.  You have headaches.  You have difficulty: ? Falling asleep. ? Sleeping throughout the night.  You feel angry, guilty, anxious, or sad.  You are unable to have a bowel movement (constipation).  You skin is dry.  Your legs or another part of your body is swollen. Get help right away if:  You feel confused.  Your vision is blurry.  You feel faint or pass out.  You have a severe headache.  You have severe  abdominal, pelvic, or back pain.  You have chest pain, shortness of breath, or an irregular or fast heartbeat.  You are unable to urinate or you urinate less than normal.  You develop abnormal bleeding, such as bleeding from the rectum, vagina, nose, lungs, or nipples.  You vomit blood.  You have thoughts about harming yourself or committing suicide.  You are worried that you might harm someone else. This information is not intended to replace advice given to you by your health care provider. Make sure you discuss any questions you have with your health care provider. Document Released: 12/01/2006 Document Revised: 07/12/2015 Document Reviewed: 06/07/2013 Elsevier Interactive Patient Education  [employer].

## 2017-04-20 NOTE — Progress Notes (Signed)
Patient presents to clinic today for CPE and to establish care.  SUBJECTIVE: PMH: Patient is a 37 year old female past medical history significant for herpes labialis.  Fatigue: -Patient endorses feeling tired -Patient does have a child under 2 and works full-time. -Patient does endorse having anemia in the past  Herpes labialis: -Patient endorses ongoing history of cold sores -May take Valtrex p.o. for this  Allergies: cephalexin-rash, skin peeling NKFA  Past surgical history: C-section 2002  Social history: Patient is married she is currently employed as a Marine scientist in the ICU.  Patient has 2 children.  Patient denies tobacco, drug use.  Patient endorses social alcohol use.  Family medical history: Mom-alive, HTN Dad-alive, prostate cancer Sister-alive Daughter-asthma MGM-hearing loss PGM-hearing loss, MI, heart disease, HLD, stroke PGF-hearing loss, MI, heart disease, HLD, stroke  Health Maintenance: Dental --[facility] Immunizations --influenza vaccine 2018 PAP --3/18   Past Medical History:  Diagnosis Date  . H/O cold sores     Past Surgical History:  Procedure Laterality Date  . CESAREAN SECTION    . ECTOPIC PREGNANCY SURGERY      Current Outpatient Medications on File Prior to Visit  Medication Sig Dispense Refill  . Prenatal Vit-Fe Fumarate-FA (PRENATAL MULTIVITAMIN) TABS tablet Take 1 tablet by mouth daily.      No current facility-administered medications on file prior to visit.     Allergies  Allergen Reactions  . Cephalexin Rash    Family History  Problem Relation Age of Onset  . Hypertension Mother   . Cancer Maternal Grandmother   . Hearing loss Maternal Grandmother   . Cancer Father   . Asthma Daughter   . Hearing loss Paternal Grandmother   . Heart disease Paternal Grandmother   . Hypertension Paternal Grandmother   . Hyperlipidemia Paternal Grandmother   . Stroke Paternal Grandmother   . Hearing loss  Paternal Grandfather   . Heart disease Paternal Grandfather   . Hyperlipidemia Paternal Grandfather   . Hypertension Paternal Grandfather   . Stroke Paternal Grandfather     Social History   Socioeconomic History  . Marital status: Single    Spouse name: Not on file  . Number of children: Not on file  . Years of education: Not on file  . Highest education level: Not on file  Social Needs  . Financial resource strain: Not on file  . Food insecurity - worry: Not on file  . Food insecurity - inability: Not on file  . Transportation needs - medical: Not on file  . Transportation needs - non-medical: Not on file  Occupational History  . Not on file  Tobacco Use  . Smoking status: Never Smoker  . Smokeless tobacco: Never Used  Substance and Sexual Activity  . Alcohol use: No  . Drug use: No  . Sexual activity: Not on file  Other Topics Concern  . Not on file  Social History Narrative  . Not on file    ROS General: Denies fever, chills, night sweats, changes in weight, changes in appetite  +fatigue HEENT: Denies headaches, ear pain, changes in vision, rhinorrhea, sore throat CV: Denies CP, palpitations, SOB, orthopnea Pulm: Denies SOB, cough, wheezing GI: Denies abdominal pain, nausea, vomiting, diarrhea, constipation GU: Denies dysuria, hematuria, frequency, vaginal discharge Msk: Denies muscle cramps, joint pains Neuro: Denies weakness, numbness, tingling Skin: Denies rashes, bruising Psych: Denies depression, anxiety, hallucinations  BP 100/60 (BP Location: Right Arm, Patient Position: Sitting, Cuff Size: Large)   Pulse 82  Temp 98.3 F (36.8 C) (Oral)   Ht 5\' 9"  (1.753 m)   Wt 218 lb (98.9 kg)   LMP 04/13/2017   SpO2 100%   Breastfeeding? No   BMI 32.19 kg/m   Physical Exam Gen. Pleasant, well developed, well-nourished, in NAD HEENT - [place]/AT, PERRL, no scleral icterus, no nasal drainage, pharynx without erythema or exudate.  TMs normal bilaterally.  No  cervical lymphadenopathy. Neck: No JVD, no thyromegaly, no carotid bruits Lungs: no use of accessory muscles, CTAB, no wheezes, rales or rhonchi Cardiovascular: RRR,  No r/g/m, no peripheral edema Abdomen: BS present, soft, nontender,nondistended, no hepatosplenomegaly Musculoskeletal: No deformities, moves all four extremities, no cyanosis or clubbing, normal tone Neuro:  A&Ox3, CN II-XII intact, normal gait Skin:  Warm, dry, intact, no lesions Psych: normal affect, mood appropriate  Recent Results (from the past 2160 hour(s))  POCT Influenza A/B     Status: Normal   Collection Time: 03/30/17 12:03 PM  Result Value Ref Range   Influenza A, POC Negative Negative   Influenza B, POC Negative Negative    Assessment/Plan: Well adult exam -Anticipatory guidance given including wearing seatbelts, smoke detectors in the home, increasing physical activity, increasing p.o. intake of water, increasing po intake of vegetables -Handout given -Pap up-to-date - Plan: Basic metabolic panel  Screening for cholesterol level  - Plan: Lipid panel  Fatigue, unspecified type  - Plan: CBC (no diff), Vitamin D, 25-hydroxy  Screening for thyroid disorder  - Plan: TSH, T4  Follow-up PRN  Grier Mitts, MD

## 2017-04-21 ENCOUNTER — Other Ambulatory Visit: Payer: Self-pay | Admitting: Family Medicine

## 2017-04-21 DIAGNOSIS — D500 Iron deficiency anemia secondary to blood loss (chronic): Secondary | ICD-10-CM

## 2017-04-21 DIAGNOSIS — E559 Vitamin D deficiency, unspecified: Secondary | ICD-10-CM

## 2017-04-21 LAB — LIPID PANEL
CHOL/HDL RATIO: 3
CHOLESTEROL: 163 mg/dL (ref 0–200)
HDL: 49.1 mg/dL (ref >39.00)
LDL Cholesterol: 101 mg/dL — ABNORMAL HIGH (ref 0–99)
NONHDL: 114.19
Triglycerides: 64 mg/dL (ref 0.0–149.0)
VLDL: 12.8 mg/dL (ref 0.0–40.0)

## 2017-04-21 LAB — CBC
HCT: 32.8 % — ABNORMAL LOW (ref 36.0–46.0)
Hemoglobin: 10.5 g/dL — ABNORMAL LOW (ref 12.0–15.0)
MCHC: 32 g/dL (ref 30.0–36.0)
MCV: 83.1 fl (ref 78.0–100.0)
Platelets: 348 10*3/uL (ref 150.0–400.0)
RBC: 3.95 Mil/uL (ref 3.87–5.11)
RDW: 14.2 % (ref 11.5–15.5)
WBC: 5.7 10*3/uL (ref 4.0–10.5)

## 2017-04-21 LAB — TSH: TSH: 1.41 u[IU]/mL (ref 0.35–4.50)

## 2017-04-21 LAB — BASIC METABOLIC PANEL
BUN: 11 mg/dL (ref 6–23)
CHLORIDE: 105 meq/L (ref 96–112)
CO2: 30 meq/L (ref 19–32)
Calcium: 9.3 mg/dL (ref 8.4–10.5)
Creatinine, Ser: 0.76 mg/dL (ref 0.40–1.20)
GFR: 108.94 mL/min (ref >60.00)
GLUCOSE: 84 mg/dL (ref 70–99)
POTASSIUM: 4.2 meq/L (ref 3.5–5.1)
Sodium: 139 mEq/L (ref 135–145)

## 2017-04-21 LAB — VITAMIN D 25 HYDROXY (VIT D DEFICIENCY, FRACTURES): VITD: 17.62 ng/mL — ABNORMAL LOW (ref 30.00–100.00)

## 2017-04-21 MED ORDER — VITAMIN D (ERGOCALCIFEROL) 1.25 MG (50000 UNIT) PO CAPS: 50000.0000 [IU] | ORAL_CAPSULE | ORAL | 0 refills | Status: DC

## 2017-04-21 MED ORDER — FERROUS SULFATE 325 (65 FE) MG PO TABS: 325.0000 mg | ORAL_TABLET | Freq: Two times a day (BID) | ORAL | 3 refills | Status: DC

## 2017-04-22 MED FILL — VIT D2 1.25 MG (50,000 UNIT: 1.25 MG | 84 days supply | Qty: 12 | Fill #0

## 2017-05-06 MED FILL — FLUCONAZOLE 150 MG TABLET: 150 | 2 days supply | Qty: 2 | Fill #0

## 2017-07-08 MED FILL — VALACYCLOVIR HCL 500 MG TAB: 500 | 60 days supply | Qty: 60 | Fill #0

## 2017-07-08 MED FILL — FLUCONAZOLE 150 MG TABS: 150 | 3 days supply | Qty: 2 | Fill #0

## 2017-08-26 MED FILL — METHYLPREDNISOLONE 8 MG TAB: 8 | 4 days supply | Qty: 8 | Fill #0

## 2017-08-26 MED FILL — ESTRADIOL 2 MG TABLET: 2 | 30 days supply | Qty: 60 | Fill #0

## 2017-08-26 MED FILL — BD NEEDLES 22GX1.5: 22G X 1-1/2 | 30 days supply | Qty: 30 | Fill #0

## 2017-08-26 MED FILL — BD 3 ML SYRINGE 18GX1-1/2: 18G X 1-1/2 | 30 days supply | Qty: 30 | Fill #0

## 2017-08-26 MED FILL — BD 3 ML SYRINGE 18GX1-1/2": 18G X 1-1/2 | 30 days supply | Qty: 30 | Fill #0

## 2017-08-26 MED FILL — BD NEEDLES 22GX1.5": 22G X 1-1/2 | 30 days supply | Qty: 30 | Fill #0

## 2017-08-26 MED FILL — PROGESTERONE OIL 50 MG/ML V: 50 | 20 days supply | Qty: 20 | Fill #0

## 2017-08-26 MED FILL — ULTICARE ALCOHOL SWABS PADS: 100 days supply | Qty: 100 | Fill #0

## 2017-08-26 MED FILL — SHARPS COLLECTOR 1.4QT: 1 days supply | Qty: 1 | Fill #0

## 2017-08-27 MED FILL — ESTRADIOL 0.1 MG PATCH: 0.1 | 48 days supply | Qty: 16 | Fill #0

## 2017-11-30 ENCOUNTER — Telehealth: Payer: Self-pay | Admitting: Family Medicine

## 2017-11-30 NOTE — Telephone Encounter (Signed)
Copied from [place] [phone]. Topic: Referral - Request for Referral >> Nov 30, 2017  4:19 PM Carolyn Stare wrote: This is a fertility specialist she has been seeing him for about 3 years and now that she has [place] insurance it is requiring her to have a referral    Referral for which specialty     Christen Butter    [phone]     [facility] [place]

## 2017-12-01 MED FILL — FLUCONAZOLE 150 MG TABS: 150 | 1 days supply | Qty: 1 | Fill #0

## 2017-12-01 NOTE — Telephone Encounter (Signed)
Ok to place referral.

## 2017-12-03 ENCOUNTER — Other Ambulatory Visit: Payer: Self-pay | Admitting: Family Medicine

## 2017-12-03 DIAGNOSIS — N979 Female infertility, unspecified: Secondary | ICD-10-CM

## 2017-12-03 NOTE — Telephone Encounter (Signed)
Referal has been placed as requested by pt

## 2017-12-03 NOTE — Telephone Encounter (Signed)
Ok

## 2017-12-07 MED FILL — ESTRADIOL 2 MG TABLET: 2 | 30 days supply | Qty: 60 | Fill #1

## 2017-12-15 MED FILL — PROGESTERONE OIL 50 MG/ML V: 50 | 20 days supply | Qty: 20 | Fill #1

## 2017-12-17 NOTE — Telephone Encounter (Signed)
Patient is calling and states they did not receive the referral and would like to know can it be sent again.

## 2017-12-22 MED FILL — DOTTI 0.1 MG/24HR PTTW: 0.1 | 48 days supply | Qty: 16 | Fill #1

## 2017-12-22 NOTE — Telephone Encounter (Signed)
Sandra Roberts spoke with pt voiced understanding that her referral was placed and she was seen on the 12/07/2017, pt voiced understanding that she needed to let her insurance know that she had a referral and was scheduled to see Dr Marzetta Board with [facility] for billing purposes.

## 2017-12-30 MED FILL — BD NEEDLES 22GX1.5: 22G X 1-1/2 | 30 days supply | Qty: 30 | Fill #1

## 2017-12-30 MED FILL — BD 3 ML SYRINGE 18GX1-1/2": 18G X 1-1/2 | 30 days supply | Qty: 30 | Fill #1

## 2017-12-30 MED FILL — BD NEEDLES 22GX1.5": 22G X 1-1/2 | 30 days supply | Qty: 30 | Fill #1

## 2017-12-30 MED FILL — BD 3 ML SYRINGE 18GX1-1/2: 18G X 1-1/2 | 30 days supply | Qty: 30 | Fill #1

## 2018-01-04 MED FILL — PROGESTERONE OIL 50 MG/ML V: 50 | 20 days supply | Qty: 20 | Fill #2

## 2018-01-05 MED FILL — ESTRADIOL 2 MG TABS: 2 | 30 days supply | Qty: 60 | Fill #2

## 2018-01-25 MED FILL — PROGESTERONE OIL 50 MG/ML V: 50 | 10 days supply | Qty: 10 | Fill #3

## 2018-02-05 LAB — OB RESULTS CONSOLE RUBELLA ANTIBODY, IGM: Rubella: IMMUNE

## 2018-02-05 LAB — OB RESULTS CONSOLE ABO/RH: RH Type: POSITIVE

## 2018-02-05 LAB — OB RESULTS CONSOLE ANTIBODY SCREEN: Antibody Screen: NEGATIVE

## 2018-02-05 LAB — OB RESULTS CONSOLE HEPATITIS B SURFACE ANTIGEN: Hepatitis B Surface Ag: NEGATIVE

## 2018-02-05 LAB — OB RESULTS CONSOLE HIV ANTIBODY (ROUTINE TESTING): HIV: NONREACTIVE

## 2018-02-05 LAB — OB RESULTS CONSOLE GC/CHLAMYDIA
Chlamydia: NEGATIVE
Gonorrhea: NEGATIVE

## 2018-02-05 LAB — OB RESULTS CONSOLE RPR: RPR: NONREACTIVE

## 2018-02-17 NOTE — L&D Delivery Note (Signed)
Delivery Note At 10:15 PM a viable and healthy female was delivered via VBAC, Spontaneous (Presentation: Right Occiput ; Anterior ).  APGAR: , ; weight pending .   Placenta status: Intact, manually extracted .  Cord: 3V    The placenta did not easily with initial attempts. THe umbilical cord avulsed and the remaining umbilical stump was grasped with a ring forcep. The pitocin infusion was discontinued to allow the uterus to release the placenta but it still remained attached. With the assistance of manual separation the placenta was delivered. THe placenta was inspected and found to be intact. THe uterus was swept and no remaining tissue was attached. Will plan 24 hrs of clindamycin to prevent endometritis  Anesthesia:  Epidural Episiotomy:  None Lacerations:  None Suture Repair: NA Est. Blood Loss (mL):  150 cc  Mom to postpartum.  Baby to Couplet care / Skin to Skin.  Vanessa Kick 08/19/2018, 10:52 PM

## 2018-07-30 LAB — OB RESULTS CONSOLE GBS: GBS: POSITIVE

## 2018-08-10 ENCOUNTER — Other Ambulatory Visit: Payer: Self-pay | Admitting: Obstetrics and Gynecology

## 2018-08-13 ENCOUNTER — Telehealth (HOSPITAL_COMMUNITY): Payer: Self-pay

## 2018-08-13 ENCOUNTER — Encounter (HOSPITAL_COMMUNITY): Payer: Self-pay

## 2018-08-13 NOTE — Telephone Encounter (Signed)
Preadmission screen  

## 2018-08-17 ENCOUNTER — Other Ambulatory Visit: Payer: Self-pay

## 2018-08-17 ENCOUNTER — Other Ambulatory Visit (HOSPITAL_COMMUNITY)
Admission: RE | Admit: 2018-08-17 | Discharge: 2018-08-17 | Disposition: A | Discharge status: Home / Self Care | Payer: No Typology Code available for payment source | Source: Ambulatory Visit | Attending: Obstetrics and Gynecology | Admitting: Obstetrics and Gynecology

## 2018-08-17 DIAGNOSIS — Z1159 Encounter for screening for other viral diseases: Secondary | ICD-10-CM | POA: Insufficient documentation

## 2018-08-17 LAB — SARS CORONAVIRUS 2 (TAT 6-24 HRS): SARS Coronavirus 2: NEGATIVE

## 2018-08-17 NOTE — MAU Note (Signed)
Swab collected without difficulty. 

## 2018-08-19 ENCOUNTER — Inpatient Hospital Stay (HOSPITAL_COMMUNITY): Payer: No Typology Code available for payment source | Admitting: Anesthesiology

## 2018-08-19 ENCOUNTER — Other Ambulatory Visit: Payer: Self-pay | Admitting: Family Medicine

## 2018-08-19 ENCOUNTER — Inpatient Hospital Stay (HOSPITAL_COMMUNITY)
Admission: AD | Admit: 2018-08-19 | Discharge: 2018-08-21 | DRG: 807 | Disposition: A | Discharge status: Home / Self Care | Payer: No Typology Code available for payment source | Attending: Obstetrics and Gynecology | Admitting: Obstetrics and Gynecology

## 2018-08-19 ENCOUNTER — Inpatient Hospital Stay (HOSPITAL_COMMUNITY): Payer: No Typology Code available for payment source

## 2018-08-19 ENCOUNTER — Other Ambulatory Visit: Payer: Self-pay

## 2018-08-19 ENCOUNTER — Encounter (HOSPITAL_COMMUNITY): Payer: Self-pay

## 2018-08-19 DIAGNOSIS — O99824 Streptococcus B carrier state complicating childbirth: Secondary | ICD-10-CM | POA: Diagnosis present

## 2018-08-19 DIAGNOSIS — O34219 Maternal care for unspecified type scar from previous cesarean delivery: Secondary | ICD-10-CM | POA: Diagnosis present

## 2018-08-19 DIAGNOSIS — R030 Elevated blood-pressure reading, without diagnosis of hypertension: Secondary | ICD-10-CM | POA: Diagnosis present

## 2018-08-19 DIAGNOSIS — Z3A39 39 weeks gestation of pregnancy: Secondary | ICD-10-CM

## 2018-08-19 DIAGNOSIS — O26893 Other specified pregnancy related conditions, third trimester: Principal | ICD-10-CM | POA: Diagnosis present

## 2018-08-19 LAB — CBC
HCT: 36.5 % (ref 36.0–46.0)
HCT: 34.7 % — ABNORMAL LOW (ref 36.0–46.0)
Hemoglobin: 11.9 g/dL — ABNORMAL LOW (ref 12.0–15.0)
Hemoglobin: 11.3 g/dL — ABNORMAL LOW (ref 12.0–15.0)
MCH: 29.2 pg (ref 26.0–34.0)
MCH: 29.3 pg (ref 26.0–34.0)
MCHC: 32.6 g/dL (ref 30.0–36.0)
MCHC: 32.6 g/dL (ref 30.0–36.0)
MCV: 89.5 fL (ref 80.0–100.0)
MCV: 89.9 fL (ref 80.0–100.0)
Platelets: UNDETERMINED 10*3/uL (ref 150–400)
Platelets: 218 10*3/uL (ref 150–400)
RBC: 4.08 MIL/uL (ref 3.87–5.11)
RBC: 3.86 MIL/uL — ABNORMAL LOW (ref 3.87–5.11)
RDW: 14.3 % (ref 11.5–15.5)
RDW: 14.3 % (ref 11.5–15.5)
WBC: 7.8 10*3/uL (ref 4.0–10.5)
WBC: 6.7 10*3/uL (ref 4.0–10.5)
nRBC: 0 % (ref 0.0–0.2)
nRBC: 0 % (ref 0.0–0.2)

## 2018-08-19 LAB — TYPE AND SCREEN
ABO/RH(D): AB POS
Antibody Screen: NEGATIVE

## 2018-08-19 LAB — RPR: RPR Ser Ql: NONREACTIVE

## 2018-08-19 MED ORDER — FENTANYL-BUPIVACAINE-NACL 0.5-0.125-0.9 MG/250ML-% EP SOLN
EPIDURAL | Status: AC
  Filled 2018-08-19: qty 250

## 2018-08-19 MED ORDER — OXYTOCIN 40 UNITS IN NORMAL SALINE INFUSION - SIMPLE MED: 1.0000 m[IU]/min | INTRAVENOUS | Status: DC

## 2018-08-19 MED ORDER — PHENYLEPHRINE 40 MCG/ML (10ML) SYRINGE FOR IV PUSH (FOR BLOOD PRESSURE SUPPORT)
PREFILLED_SYRINGE | INTRAVENOUS | Status: AC
  Filled 2018-08-19: qty 10

## 2018-08-19 MED ORDER — OXYTOCIN BOLUS FROM INFUSION
500.0000 mL | Freq: Once | INTRAVENOUS | Status: AC
  Administered 2018-08-19: 23:00:00 500 mL via INTRAVENOUS

## 2018-08-19 MED ORDER — LIDOCAINE HCL (PF) 1 % IJ SOLN
INTRAMUSCULAR | Status: DC | PRN
  Administered 2018-08-19: 3 mL via EPIDURAL
  Administered 2018-08-19: 2 mL via EPIDURAL
  Administered 2018-08-19: 5 mL via EPIDURAL

## 2018-08-19 MED ORDER — SODIUM CHLORIDE 0.9 % IV SOLN
5.0000 10*6.[IU] | Freq: Once | INTRAVENOUS | Status: AC
  Administered 2018-08-19: 09:00:00 5 10*6.[IU] via INTRAVENOUS
  Filled 2018-08-19: qty 5

## 2018-08-19 MED ORDER — CLINDAMYCIN PHOSPHATE 900 MG/50ML IV SOLN
900.0000 mg | Freq: Three times a day (TID) | INTRAVENOUS | Status: AC
  Administered 2018-08-19 – 2018-08-20 (×3): 900 mg via INTRAVENOUS
  Filled 2018-08-19 (×4): qty 50

## 2018-08-19 MED ORDER — TERBUTALINE SULFATE 1 MG/ML IJ SOLN: 0.2500 mg | Freq: Once | INTRAMUSCULAR | Status: DC | PRN

## 2018-08-19 MED ORDER — ONDANSETRON HCL 4 MG/2ML IJ SOLN
4.0000 mg | Freq: Four times a day (QID) | INTRAMUSCULAR | Status: DC | PRN
  Administered 2018-08-19: 4 mg via INTRAVENOUS
  Filled 2018-08-19: qty 2

## 2018-08-19 MED ORDER — LIDOCAINE HCL (PF) 1 % IJ SOLN: 30.0000 mL | INTRAMUSCULAR | Status: DC | PRN

## 2018-08-19 MED ORDER — SODIUM CHLORIDE (PF) 0.9 % IJ SOLN
INTRAMUSCULAR | Status: DC | PRN
  Administered 2018-08-19: 12 mL/h via EPIDURAL

## 2018-08-19 MED ORDER — LACTATED RINGERS IV SOLN
500.0000 mL | INTRAVENOUS | Status: DC | PRN
  Administered 2018-08-19 (×2): 500 mL via INTRAVENOUS

## 2018-08-19 MED ORDER — OXYTOCIN 40 UNITS IN NORMAL SALINE INFUSION - SIMPLE MED
2.5000 [IU]/h | INTRAVENOUS | Status: DC
  Administered 2018-08-19: 23:00:00 2.5 [IU]/h via INTRAVENOUS

## 2018-08-19 MED ORDER — ACETAMINOPHEN 325 MG PO TABS: 650.0000 mg | ORAL_TABLET | ORAL | Status: DC | PRN

## 2018-08-19 MED ORDER — OXYTOCIN 40 UNITS IN NORMAL SALINE INFUSION - SIMPLE MED
1.0000 m[IU]/min | INTRAVENOUS | Status: DC
  Administered 2018-08-19: 4 m[IU]/min via INTRAVENOUS
  Filled 2018-08-19: qty 1000

## 2018-08-19 MED ORDER — PENICILLIN G 3 MILLION UNITS IVPB - SIMPLE MED
3.0000 10*6.[IU] | INTRAVENOUS | Status: DC
  Administered 2018-08-19 (×3): 3 10*6.[IU] via INTRAVENOUS
  Filled 2018-08-19 (×3): qty 100

## 2018-08-19 MED ORDER — LACTATED RINGERS IV SOLN
INTRAVENOUS | Status: DC
  Administered 2018-08-19: 125 mL/h via INTRAVENOUS
  Administered 2018-08-19: 15:00:00 via INTRAVENOUS

## 2018-08-19 MED ORDER — SOD CITRATE-CITRIC ACID 500-334 MG/5ML PO SOLN: 30.0000 mL | ORAL | Status: DC | PRN

## 2018-08-19 NOTE — H&P (Signed)
Sandra Roberts is a 38 y.o. female presenting for IOL for AMA  38 yo [id number] @ 39+0 for IOL for AMA. Her pregnancy was conceived by IVF. SHe has a history of a prior cesarean section with G1, G2 was a successful VBAC OB History    Gravida  5   Para  2   Term  2   Preterm      AB  2   Living  2     SAB  1   TAB      Ectopic  1   Multiple  0   Live Births  2          Past Medical History:  Diagnosis Date  . H/O cold sores   . Varicella    Past Surgical History:  Procedure Laterality Date  . CESAREAN SECTION    . ECTOPIC PREGNANCY SURGERY     Family History: family history includes Asthma in her daughter; Cancer in her father and maternal grandmother; Hearing loss in her maternal grandmother, paternal grandfather, and paternal grandmother; Heart disease in her paternal grandfather and paternal grandmother; Hyperlipidemia in her paternal grandfather and paternal grandmother; Hypertension in her maternal grandmother, mother, paternal grandfather, and paternal grandmother; Pulmonary embolism in her maternal grandmother; Stroke in her paternal grandfather and paternal grandmother. Social History:  reports that she has never smoked. She has never used smokeless tobacco. She reports that she does not drink alcohol or use drugs.     Maternal Diabetes: No Genetic Screening: Normal Maternal Ultrasounds/Referrals: Normal Fetal Ultrasounds or other Referrals:  None Maternal Substance Abuse:  No Significant Maternal Medications:  None Significant Maternal Lab Results:  None Other Comments:  None  ROS History Dilation: 3 Effacement (%): 60 Station: -2 Exam by:: [name], RN  Blood pressure 115/63, pulse 87, temperature 98.2 F (36.8 C), temperature source Oral, resp. rate 18, height 5\' 10"  (1.778 m), weight 117.5 kg. Exam Physical Exam  Prenatal labs: ABO, Rh: --/--/AB POS (07/02 0800) Antibody: NEG Performed at [facility], [place]  [address] [place],  [address]  [phone]/02 0800) Rubella: Immune (12/20 0000) RPR: Nonreactive (12/20 0000)  HBsAg: Negative (12/20 0000)  HIV: Non-reactive (12/20 0000)  GBS: Positive (06/12 0000)   Assessment/Plan: 1) admit 2) Pitocin 3) PCN for GBS 4) Epidural on request   Vanessa Kick 08/19/2018, 9:35 AM

## 2018-08-19 NOTE — Anesthesia Procedure Notes (Signed)
Epidural Patient location during procedure: OB Start time: 08/19/2018 4:44 PM End time: 08/19/2018 4:49 PM  Staffing Anesthesiologist: Suzette Battiest, MD Performed: anesthesiologist   Preanesthetic Checklist Completed: patient identified, site marked, surgical consent, pre-op evaluation, timeout performed, IV checked, risks and benefits discussed and monitors and equipment checked  Epidural Patient position: sitting Prep: site prepped and draped and DuraPrep Patient monitoring: continuous pulse ox and blood pressure Approach: midline Location: L4-L5 Injection technique: LOR air  Needle:  Needle type: Tuohy  Needle gauge: 17 G Needle length: 9 cm and 9 Needle insertion depth: 8 cm Catheter type: closed end flexible Catheter size: 19 Gauge Catheter at skin depth: 13 cm Test dose: negative  Assessment Events: blood not aspirated, injection not painful, no injection resistance, negative IV test and no paresthesia

## 2018-08-19 NOTE — Anesthesia Preprocedure Evaluation (Signed)
Anesthesia Evaluation  Patient identified by MRN, date of birth, ID band Patient awake    Reviewed: Allergy & Precautions, Patient's Chart, lab work & pertinent test results  Airway Mallampati: II       Dental   Pulmonary    Pulmonary exam normal        Cardiovascular Normal cardiovascular exam     Neuro/Psych    GI/Hepatic   Endo/Other    Renal/GU      Musculoskeletal   Abdominal   Peds  Hematology  (+) anemia ,   Anesthesia Other Findings   Reproductive/Obstetrics (+) Pregnancy                             Anesthesia Physical Anesthesia Plan  ASA: II  Anesthesia Plan: Epidural   Post-op Pain Management:    Induction:   PONV Risk Score and Plan: Treatment may vary due to age or medical condition  Airway Management Planned: Natural Airway  Additional Equipment:   Intra-op Plan:   Post-operative Plan:   Informed Consent: I have reviewed the patients History and Physical, chart, labs and discussed the procedure including the risks, benefits and alternatives for the proposed anesthesia with the patient or authorized representative who has indicated his/her understanding and acceptance.       Plan Discussed with:   Anesthesia Plan Comments:         Anesthesia Quick Evaluation

## 2018-08-20 ENCOUNTER — Encounter (HOSPITAL_COMMUNITY): Payer: Self-pay

## 2018-08-20 DIAGNOSIS — O34219 Maternal care for unspecified type scar from previous cesarean delivery: Secondary | ICD-10-CM | POA: Diagnosis present

## 2018-08-20 LAB — CBC
HCT: 37 % (ref 36.0–46.0)
Hemoglobin: 12.4 g/dL (ref 12.0–15.0)
MCH: 30.2 pg (ref 26.0–34.0)
MCHC: 33.5 g/dL (ref 30.0–36.0)
MCV: 90.2 fL (ref 80.0–100.0)
Platelets: 222 10*3/uL (ref 150–400)
RBC: 4.1 MIL/uL (ref 3.87–5.11)
RDW: 14.1 % (ref 11.5–15.5)
WBC: 11 10*3/uL — ABNORMAL HIGH (ref 4.0–10.5)
nRBC: 0.2 % (ref 0.0–0.2)

## 2018-08-20 LAB — ABO/RH: ABO/RH(D): AB POS

## 2018-08-20 MED ORDER — ACETAMINOPHEN 325 MG PO TABS: 650.0000 mg | ORAL_TABLET | ORAL | Status: DC | PRN

## 2018-08-20 MED ORDER — PRENATAL MULTIVITAMIN CH
1.0000 | ORAL_TABLET | Freq: Every day | ORAL | Status: DC
  Administered 2018-08-20: 12:00:00 1 via ORAL
  Filled 2018-08-20: qty 1

## 2018-08-20 MED ORDER — DIBUCAINE (PERIANAL) 1 % EX OINT: 1.0000 "application " | TOPICAL_OINTMENT | CUTANEOUS | Status: DC | PRN

## 2018-08-20 MED ORDER — WITCH HAZEL-GLYCERIN EX PADS: 1.0000 "application " | MEDICATED_PAD | CUTANEOUS | Status: DC | PRN

## 2018-08-20 MED ORDER — OXYCODONE-ACETAMINOPHEN 5-325 MG PO TABS: 2.0000 | ORAL_TABLET | ORAL | Status: DC | PRN

## 2018-08-20 MED ORDER — SENNOSIDES-DOCUSATE SODIUM 8.6-50 MG PO TABS
2.0000 | ORAL_TABLET | ORAL | Status: DC
  Administered 2018-08-20: 23:00:00 2 via ORAL
  Filled 2018-08-20: qty 2

## 2018-08-20 MED ORDER — ONDANSETRON HCL 4 MG PO TABS: 4.0000 mg | ORAL_TABLET | ORAL | Status: DC | PRN

## 2018-08-20 MED ORDER — METHYLERGONOVINE MALEATE 0.2 MG PO TABS: 0.2000 mg | ORAL_TABLET | ORAL | Status: DC | PRN

## 2018-08-20 MED ORDER — OXYCODONE-ACETAMINOPHEN 5-325 MG PO TABS: 1.0000 | ORAL_TABLET | ORAL | Status: DC | PRN

## 2018-08-20 MED ORDER — IBUPROFEN 600 MG PO TABS
600.0000 mg | ORAL_TABLET | Freq: Four times a day (QID) | ORAL | Status: DC
  Administered 2018-08-20 (×3): 600 mg via ORAL
  Filled 2018-08-20 (×5): qty 1

## 2018-08-20 MED ORDER — SIMETHICONE 80 MG PO CHEW: 80.0000 mg | CHEWABLE_TABLET | ORAL | Status: DC | PRN

## 2018-08-20 MED ORDER — METHYLERGONOVINE MALEATE 0.2 MG/ML IJ SOLN: 0.2000 mg | INTRAMUSCULAR | Status: DC | PRN

## 2018-08-20 MED ORDER — TETANUS-DIPHTH-ACELL PERTUSSIS 5-2.5-18.5 LF-MCG/0.5 IM SUSP: 0.5000 mL | Freq: Once | INTRAMUSCULAR | Status: DC

## 2018-08-20 MED ORDER — ONDANSETRON HCL 4 MG/2ML IJ SOLN: 4.0000 mg | INTRAMUSCULAR | Status: DC | PRN

## 2018-08-20 MED ORDER — ZOLPIDEM TARTRATE 5 MG PO TABS: 5.0000 mg | ORAL_TABLET | Freq: Every evening | ORAL | Status: DC | PRN

## 2018-08-20 MED ORDER — DIPHENHYDRAMINE HCL 25 MG PO CAPS: 25.0000 mg | ORAL_CAPSULE | Freq: Four times a day (QID) | ORAL | Status: DC | PRN

## 2018-08-20 MED ORDER — BENZOCAINE-MENTHOL 20-0.5 % EX AERO: 1.0000 "application " | INHALATION_SPRAY | CUTANEOUS | Status: DC | PRN

## 2018-08-20 MED ORDER — COCONUT OIL OIL
1.0000 "application " | TOPICAL_OIL | Status: DC | PRN
  Administered 2018-08-20: 1 via TOPICAL

## 2018-08-20 NOTE — Anesthesia Postprocedure Evaluation (Signed)
Anesthesia Post Note  Patient: Debroh Sieloff  Procedure(s) Performed: AN AD [place]     Patient location during evaluation: Mother Baby Anesthesia Type: Epidural Level of consciousness: awake and alert Pain management: pain level controlled Vital Signs Assessment: post-procedure vital signs reviewed and stable Respiratory status: spontaneous breathing Cardiovascular status: blood pressure returned to baseline Postop Assessment: no headache Anesthetic complications: no    Last Vitals:  Vitals:   08/20/18 0202 08/20/18 0554  BP: 138/71 137/78  Pulse: 86 77  Resp: 18 18  Temp: 37.1 C 36.8 C  SpO2: 95% 96%    Last Pain:  Vitals:   08/20/18 0554  TempSrc: Oral  PainSc: 0-No pain                 Korbyn Vanes

## 2018-08-20 NOTE — Lactation Note (Signed)
This note was copied from a baby's chart. Lactation Consultation Note:  P2, mother reports that she has a 2.40 yr old at home that she breastfed.  infant is 36 hours old. Mother reports that infant is in the nursery for a circumcision.  Mother reports that infant is feeding well. She would like some one to observe the next feeding.  Mother advised to hand express. Reviewed hand expression. Mother reports seeing colostrum.   Suggested that mother page for staff nurse of [facility] when infant is ready for next feeding.   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 Suggested that mother decide which one she wants.  Mother has a [place] at the bedside that she brought from home. Mother reports that she has been pumping. Advised mother to breastfeed infant at least 8-12 times in 24 hours. Discussed cue base feeding and cluster feeding.  Mother was given [facility] brochure with information on all [place] services at [facility].  Patient Name: Boy Arlesia Kiel [id number]'S Date: 08/20/2018 Reason for consult: Initial assessment   Maternal Data    Feeding Feeding Type: (infant getting circumcision, page for assist next feeding)  LATCH Score                   Interventions Interventions: Breast feeding basics reviewed  Lactation Tools Discussed/Used     Consult Status Consult Status: Follow-up Date: 08/21/18 Follow-up type: In-patient    Jess [facility] 08/20/2018, 4:31 PM

## 2018-08-20 NOTE — Progress Notes (Signed)
Patient is doing well.  She is ambulating, voiding, tolerating PO.  Pain control is good.  Lochia is appropriate.  Occasional elevated systolic BPs 096-283M.  Diastolics are normal.  Patient is asymptomatic  Vitals:   08/20/18 0055 08/20/18 0202 08/20/18 0554 08/20/18 1013  BP: (!) 142/69 138/71 137/78 (!) 141/65  Pulse: 76 86 77 84  Resp: 18 18 18 18   Temp: 98.4 F (36.9 C) 98.8 F (37.1 C) 98.2 F (36.8 C) 97.9 F (36.6 C)  TempSrc: Oral Oral Oral Oral  SpO2:  95% 96%   Weight:      Height:        NAD Fundus firm Ext: trace edema b/l  Lab Results  Component Value Date   WBC 11.0 (H) 08/20/2018   HGB 12.4 08/20/2018   HCT 37.0 08/20/2018   MCV 90.2 08/20/2018   PLT 222 08/20/2018    --/--/AB POS, AB POS (07/02 0800)/RImmune  A/P 38 y.o. [id number] PPD#1. Routine care.    Mild elevation of blood pressures.  Will monitor BPs more closely/frequently today.  Patient is asymptomatic at this time  Desires circumcision. Discussed r/b/a of the procedure. Reviewed that circumcision is an elective surgical procedure and not considered medically necessary. Reviewed the risks of the procedure including the risk of infection, bleeding, damage to surrounding structures, including scrotum, shaft, urethra and head of penis, and an undesired cosmetic effect requiring additional procedures for revision. Consent signed.   Expect d/c tomorrow.    Luke

## 2018-08-21 NOTE — Lactation Note (Signed)
This note was copied from a baby's chart. Lactation Consultation Note  Patient Name: Sandra Roberts [id number] Date: 08/21/2018 Reason for consult: Follow-up assessment   Baby 86 hours old. Mother [name]. Provided DEBP from [employer]. Mother's has a less evert nipple on the R side. Had mother prepump w/ manual pump to help evert. She hand expressed good flow before latching and he latched in cross cradle hold. Demonstrated how to widen latch with chin tug. Sucks and swallows observed. Encouraged breast compression. Feed on demand approximately 8-12 times per day.   Reviewed engorgement care and monitoring voids/stools.    Maternal Data    Feeding Feeding Type: Breast Fed  LATCH Score Latch: Repeated attempts needed to sustain latch, nipple held in mouth throughout feeding, stimulation needed to elicit sucking reflex.  Audible Swallowing: A few with stimulation  Type of Nipple: Everted at rest and after stimulation  Comfort (Breast/Nipple): Soft / non-tender  Hold (Positioning): No assistance needed to correctly position infant at breast.  LATCH Score: 8  Interventions Interventions: Breast feeding basics reviewed;Assisted with latch;Skin to skin;Hand express;Pre-pump if needed;DEBP;Breast compression  Lactation Tools Discussed/Used     Consult Status Consult Status: Complete Date: 08/21/18    Vivianne Master [facility] 08/21/2018, 10:28 AM

## 2018-08-21 NOTE — Progress Notes (Signed)
Patient is doing well.  She is ambulating, voiding, tolerating PO.  Pain control is good.  Lochia is appropriate.  BPs remained stable yesterday.    Vitals:   08/20/18 1502 08/20/18 1807 08/20/18 2137 08/21/18 0148  BP: 122/70 (!) 144/81 138/74 128/75  Pulse: 91 83 87 73  Resp: 18  16 18   Temp: 98.5 F (36.9 C)  98.9 F (37.2 C) 98.5 F (36.9 C)  TempSrc: Oral  Oral Oral  SpO2:   99% 100%  Weight:      Height:        NAD Fundus firm Ext: trace edema b/l  Lab Results  Component Value Date   WBC 11.0 (H) 08/20/2018   HGB 12.4 08/20/2018   HCT 37.0 08/20/2018   MCV 90.2 08/20/2018   PLT 222 08/20/2018    --/--/AB POS, AB POS (07/02 0800)/[facility]  A/P 38 y.o. [id number] PPD#2  Mild elevation of blood pressures.  Occasional 140s/60-80s, most 120-130/70s.  Do not feel that this warrants anti-hypertensive therapy at this time.   Will have patient monitor BP with home cuff (pt is an Therapist, sports) Meeting all goals.  Discharge to home today.    [place]

## 2018-08-21 NOTE — Discharge Summary (Signed)
Obstetric Discharge Summary Reason for Admission: induction of labor Prenatal Procedures: none Intrapartum Procedures: spontaneous vaginal delivery Postpartum Procedures: none Complications-Operative and Postpartum: manual removal of placenta Hemoglobin  Date Value Ref Range Status  08/20/2018 12.4 12.0 - 15.0 g/dL Final   HCT  Date Value Ref Range Status  08/20/2018 37.0 36.0 - 46.0 % Final    Physical Exam:  General: alert, cooperative and appears stated age 37: appropriate Uterine Fundus: firm DVT Evaluation: No evidence of DVT seen on physical exam.  Discharge Diagnoses: Term Pregnancy-delivered  Discharge Information: Date: 08/21/2018 Activity: pelvic rest Diet: routine Medications: PNV, Ibuprofen and tylenol Condition: stable Instructions: refer to practice specific booklet Discharge to: home Follow-up Information    Vanessa Kick, MD Follow up in 4 week(s).   Specialty: Obstetrics and Gynecology Contact information: [address] [place] [address] [phone]           Newborn Data: Live born female  Birth Weight: 8 lb 5.7 oz (3790 g) APGAR: 7, 9  Newborn Delivery   Birth date/time: 08/19/2020 22:15:00 Delivery type: VBAC, Spontaneous      Home with mother.  [place] 08/21/2018, 9:03 AM

## 2018-09-21 MED FILL — NORLYDA 0.35 MG TABS: 0.35 | 28 days supply | Qty: 28 | Fill #0

## 2018-10-13 ENCOUNTER — Encounter: Payer: Self-pay | Admitting: Family Medicine

## 2018-10-13 ENCOUNTER — Other Ambulatory Visit: Payer: Self-pay

## 2018-10-13 ENCOUNTER — Ambulatory Visit (INDEPENDENT_AMBULATORY_CARE_PROVIDER_SITE_OTHER): Payer: No Typology Code available for payment source | Admitting: Family Medicine

## 2018-10-13 VITALS — BP 108/78 | HR 79 | Temp 95.9°F | Wt 248.0 lb

## 2018-10-13 DIAGNOSIS — Z0000 Encounter for general adult medical examination without abnormal findings: Secondary | ICD-10-CM

## 2018-10-13 DIAGNOSIS — D229 Melanocytic nevi, unspecified: Secondary | ICD-10-CM

## 2018-10-13 DIAGNOSIS — Z13220 Encounter for screening for lipoid disorders: Secondary | ICD-10-CM | POA: Diagnosis not present

## 2018-10-13 DIAGNOSIS — Z131 Encounter for screening for diabetes mellitus: Secondary | ICD-10-CM

## 2018-10-13 NOTE — Progress Notes (Signed)
Subjective:     Sandra Roberts is a 38 y.o. female and is here for a comprehensive physical exam. The patient reports no problems.  Pt notes bumps in corners of b/l eyes.  Area on left lower lid painful at times and had increased in size.  Pt denies drainage, bleeding, pustule, or erythema.   Pt just had a baby boy in July who is doing well.  Pt states she is breastfeeding.  Pt's mood is good.  She is a little tired and her energy is ok.  Pt's husband is supportive.  Pt walking for exercise.  Social History   Socioeconomic History  . Marital status: Married    Spouse name: Not on file  . Number of children: Not on file  . Years of education: Not on file  . Highest education level: Not on file  Occupational History  . Not on file  Social Needs  . Financial resource strain: Not hard at all  . Food insecurity    Worry: Never true    Inability: Never true  . Transportation needs    Medical: No    Non-medical: No  Tobacco Use  . Smoking status: Never Smoker  . Smokeless tobacco: Never Used  Substance and Sexual Activity  . Alcohol use: No  . Drug use: No  . Sexual activity: Yes  Lifestyle  . Physical activity    Days per week: Not on file    Minutes per session: Not on file  . Stress: Only a little  Relationships  . Social Herbalist on phone: Not on file    Gets together: Not on file    Attends religious service: Not on file    Active member of club or organization: Not on file    Attends meetings of clubs or organizations: Not on file    Relationship status: Not on file  . Intimate partner violence    Fear of current or ex partner: No    Emotionally abused: No    Physically abused: No    Forced sexual activity: No  Other Topics Concern  . Not on file  Social History Narrative  . Not on file   Health Maintenance  Topic Date Due  . INFLUENZA VACCINE  09/18/2018  . PAP SMEAR-Modifier  09/28/2021  . TETANUS/TDAP  06/03/2028  . HIV Screening   Completed    The following portions of the patient's history were reviewed and updated as appropriate: allergies, current medications, past family history, past medical history, past social history, past surgical history and problem list.  Review of Systems Pertinent items noted in HPI and remainder of comprehensive ROS otherwise negative.   Objective:    BP 108/78   Pulse 79   Temp (!) 95.9 F (35.5 C) (Temporal)   Wt 248 lb (112.5 kg)   SpO2 98%   Breastfeeding Yes Comment: No period since birth of son  BMI 35.58 kg/m  General appearance: alert, cooperative and no distress Head: Normocephalic, without obvious abnormality, atraumatic Eyes: conjunctivae/corneas clear. PERRL, EOM's intact. Fundi benign. Ears: normal TM's and external ear canals both ears Nose: Nares normal. Septum midline. Mucosa normal. No drainage or sinus tenderness. Throat: lips, mucosa, and tongue normal; teeth and gums normal Neck: no adenopathy, no carotid bruit, no JVD, supple, symmetrical, trachea midline and thyroid not enlarged, symmetric, no tenderness/mass/nodules Lungs: clear to auscultation bilaterally Heart: regular rate and rhythm, S1, S2 normal, no murmur, click, rub or gallop Abdomen: soft,  non-tender; bowel sounds normal; no masses,  no organomegaly Extremities: extremities normal, atraumatic, no cyanosis or edema Pulses: 2+ and symmetric Skin: Skin color, texture, turgor normal. No rashes or lesions or a cluster of 3 flesh colored vesicles in corner of L lateral eye bottom lid.  R lateral lower lid with a flatter flesh colored vesicle. Lymph nodes: Cervical, supraclavicular, and axillary nodes normal. Neurologic: Alert and oriented X 3, normal strength and tone. Normal symmetric reflexes. Normal coordination and gait    Assessment:    Healthy female exam.      Plan:     Anticipatory guidance given including wearing seatbelts, smoke detectors in the home, increasing physical activity,  increasing p.o. intake of water and vegetables. -will obtain labs -will postpone mammogram as pt breastfeeding -pap up to date, followed by OB/Gyn -given handout -next CPE in 1 yr See After Visit Summary for Counseling Recommendations    Multiple nevi -lesions at corners of eyes -referral to Dermatology for removal  F/u prn   Grier Mitts, MD

## 2018-10-13 NOTE — Patient Instructions (Signed)
Preventive Care 72-40 Years Old, Female Preventive care refers to visits with your health care provider and lifestyle choices that can promote health and wellness. This includes:  A yearly physical exam. This may also be called an annual well check.  Regular dental visits and eye exams.  Immunizations.  Screening for certain conditions.  Healthy lifestyle choices, such as eating a healthy diet, getting regular exercise, not using drugs or products that contain nicotine and tobacco, and limiting alcohol use. What can I expect for my preventive care visit? Physical exam Your health care provider will check your:  Height and weight. This may be used to calculate body mass index (BMI), which tells if you are at a healthy weight.  Heart rate and blood pressure.  Skin for abnormal spots. Counseling Your health care provider may ask you questions about your:  Alcohol, tobacco, and drug use.  Emotional well-being.  Home and relationship well-being.  Sexual activity.  Eating habits.  Work and work Statistician.  Method of birth control.  Menstrual cycle.  Pregnancy history. What immunizations do I need?  Influenza (flu) vaccine  This is recommended every year. Tetanus, diphtheria, and pertussis (Tdap) vaccine  You may need a Td booster every 10 years. Varicella (chickenpox) vaccine  You may need this if you have not been vaccinated. Zoster (shingles) vaccine  You may need this after age 40. Measles, mumps, and rubella (MMR) vaccine  You may need at least one dose of MMR if you were born in 1957 or later. You may also need a second dose. Pneumococcal conjugate (PCV13) vaccine  You may need this if you have certain conditions and were not previously vaccinated. Pneumococcal polysaccharide (PPSV23) vaccine  You may need one or two doses if you smoke cigarettes or if you have certain conditions. Meningococcal conjugate (MenACWY) vaccine  You may need this if you  have certain conditions. Hepatitis A vaccine  You may need this if you have certain conditions or if you travel or work in places where you may be exposed to hepatitis A. Hepatitis B vaccine  You may need this if you have certain conditions or if you travel or work in places where you may be exposed to hepatitis B. Haemophilus influenzae type b (Hib) vaccine  You may need this if you have certain conditions. Human papillomavirus (HPV) vaccine  If recommended by your health care provider, you may need three doses over 6 months. You may receive vaccines as individual doses or as more than one vaccine together in one shot (combination vaccines). Talk with your health care provider about the risks and benefits of combination vaccines. What tests do I need? Blood tests  Lipid and cholesterol levels. These may be checked every 5 years, or more frequently if you are over 5 years old.  Hepatitis C test.  Hepatitis B test. Screening  Lung cancer screening. You may have this screening every year starting at age 55 if you have a 30-pack-year history of smoking and currently smoke or have quit within the past 15 years.  Colorectal cancer screening. All adults should have this screening starting at age 20 and continuing until age 40. Your health care provider may recommend screening at age 49 if you are at increased risk. You will have tests every 1-10 years, depending on your results and the type of screening test.  Diabetes screening. This is done by checking your blood sugar (glucose) after you have not eaten for a while (fasting). You may have this  done every 1-3 years.  Mammogram. This may be done every 1-2 years. Talk with your health care provider about when you should start having regular mammograms. This may depend on whether you have a family history of breast cancer.  BRCA-related cancer screening. This may be done if you have a family history of breast, ovarian, tubal, or peritoneal  cancers.  Pelvic exam and Pap test. This may be done every 3 years starting at age 21. Starting at age 30, this may be done every 5 years if you have a Pap test in combination with an HPV test. Other tests  Sexually transmitted disease (STD) testing.  Bone density scan. This is done to screen for osteoporosis. You may have this scan if you are at high risk for osteoporosis. Follow these instructions at home: Eating and drinking  Eat a diet that includes fresh fruits and vegetables, whole grains, lean protein, and low-fat dairy.  Take vitamin and mineral supplements as recommended by your health care provider.  Do not drink alcohol if: ? Your health care provider tells you not to drink. ? You are pregnant, may be pregnant, or are planning to become pregnant.  If you drink alcohol: ? Limit how much you have to 0-1 drink a day. ? Be aware of how much alcohol is in your drink. In the [place] one drink equals one 12 oz bottle of beer (355 mL), one 5 oz glass of wine (148 mL), or one 1 oz glass of hard liquor (44 mL). Lifestyle  Take daily care of your teeth and gums.  Stay active. Exercise for at least 30 minutes on 5 or more days each week.  Do not use any products that contain nicotine or tobacco, such as cigarettes, e-cigarettes, and chewing tobacco. If you need help quitting, ask your health care provider.  If you are sexually active, practice safe sex. Use a condom or other form of birth control (contraception) in order to prevent pregnancy and STIs (sexually transmitted infections).  If told by your health care provider, take low-dose aspirin daily starting at age 50. What's next?  Visit your health care provider once a year for a well check visit.  Ask your health care provider how often you should have your eyes and teeth checked.  Stay up to date on all vaccines. This information is not intended to replace advice given to you by your health care provider. Make sure you  discuss any questions you have with your health care provider. Document Released: 03/02/2015 Document Revised: 10/15/2017 Document Reviewed: 10/15/2017 Elsevier Patient Education  2020 Else[employer] Inc[employer]  

## 2018-10-14 LAB — HEMOGLOBIN A1C: Hgb A1c MFr Bld: 5.6 % (ref 4.6–6.5)

## 2018-10-14 LAB — CBC
HCT: 40.5 % (ref 36.0–46.0)
Hemoglobin: 13.2 g/dL (ref 12.0–15.0)
MCHC: 32.6 g/dL (ref 30.0–36.0)
MCV: 87.5 fl (ref 78.0–100.0)
Platelets: 249 10*3/uL (ref 150.0–400.0)
RBC: 4.62 Mil/uL (ref 3.87–5.11)
RDW: 14.4 % (ref 11.5–15.5)
WBC: 5 10*3/uL (ref 4.0–10.5)

## 2018-10-14 LAB — LIPID PANEL
Cholesterol: 174 mg/dL (ref 0–200)
HDL: 53.7 mg/dL (ref >39.00)
LDL Cholesterol: 95 mg/dL (ref 0–99)
NonHDL: 120.66
Total CHOL/HDL Ratio: 3
Triglycerides: 129 mg/dL (ref 0.0–149.0)
VLDL: 25.8 mg/dL (ref 0.0–40.0)

## 2018-10-14 LAB — BASIC METABOLIC PANEL
BUN: 12 mg/dL (ref 6–23)
CO2: 27 mEq/L (ref 19–32)
Calcium: 9 mg/dL (ref 8.4–10.5)
Chloride: 105 mEq/L (ref 96–112)
Creatinine, Ser: 0.81 mg/dL (ref 0.40–1.20)
GFR: 94.52 mL/min (ref >60.00)
Glucose, Bld: 80 mg/dL (ref 70–99)
Potassium: 4.1 mEq/L (ref 3.5–5.1)
Sodium: 140 mEq/L (ref 135–145)

## 2019-01-17 MED FILL — NEO/POLY/DEXAMET EYE OINT: 3.5-10000-0 | 3 days supply | Qty: 4 | Fill #0

## 2019-01-24 MED FILL — ERYTHROMYCIN EYE OINTMENT: 5 | 3 days supply | Qty: 4 | Fill #0

## 2019-03-03 MED FILL — TERCONAZOLE 0.8% VAGINAL CR: 0.8 | 3 days supply | Qty: 20 | Fill #0

## 2019-10-05 MED FILL — VALACYCLOVIR HCL 500 MG TAB: 500 | 30 days supply | Qty: 60 | Fill #0

## 2020-01-19 ENCOUNTER — Other Ambulatory Visit: Payer: Self-pay | Admitting: Family Medicine

## 2020-01-19 ENCOUNTER — Ambulatory Visit (INDEPENDENT_AMBULATORY_CARE_PROVIDER_SITE_OTHER): Payer: No Typology Code available for payment source | Admitting: Family Medicine

## 2020-01-19 ENCOUNTER — Encounter: Payer: Self-pay | Admitting: Family Medicine

## 2020-01-19 ENCOUNTER — Other Ambulatory Visit: Payer: Self-pay

## 2020-01-19 VITALS — BP 110/80 | HR 92 | Temp 98.1°F | Ht 70.0 in | Wt 255.8 lb

## 2020-01-19 DIAGNOSIS — L309 Dermatitis, unspecified: Secondary | ICD-10-CM | POA: Diagnosis not present

## 2020-01-19 DIAGNOSIS — R5383 Other fatigue: Secondary | ICD-10-CM | POA: Diagnosis not present

## 2020-01-19 DIAGNOSIS — L720 Epidermal cyst: Secondary | ICD-10-CM | POA: Diagnosis not present

## 2020-01-19 DIAGNOSIS — Z0001 Encounter for general adult medical examination with abnormal findings: Secondary | ICD-10-CM | POA: Diagnosis not present

## 2020-01-19 DIAGNOSIS — R0683 Snoring: Secondary | ICD-10-CM | POA: Diagnosis not present

## 2020-01-19 DIAGNOSIS — R635 Abnormal weight gain: Secondary | ICD-10-CM | POA: Diagnosis not present

## 2020-01-19 MED ORDER — CLOBETASOL PROPIONATE 0.05 % EX OINT: 1.0000 "application " | TOPICAL_OINTMENT | Freq: Two times a day (BID) | CUTANEOUS | 1 refills | Status: DC

## 2020-01-19 MED FILL — CLOBETASOL PROPIONATE 0.05: 0.05 | 21 days supply | Qty: 45 | Fill #0

## 2020-01-19 NOTE — Progress Notes (Signed)
Subjective:     TRUE Sandra Roberts is a 39 y.o. female and is here for a comprehensive physical exam. Pt doing well overall.  Endorses having 2 bumps on her back that have a smelly content when squeezed.  One of the bumps itches at times.  Patient unable to have mammogram done and she is producing some milk.  Able to manually express milk when in the shower.  Patient stopped breast-feeding when her son was 71 months old.  He is now 62 months old.  Pt denies pain in the breast, leakage, or erythema.  LMP 01/13/2020.  Pt notes clear vaginal discharge without irritation or odor.  Pt endorses rash between breasts that is nonpruritic.  Clears up with clobetasol ointment.  Pt requesting refill on clobetasol.  Pt notes difficulty losing weight states has no motivation/energy 5.  Notes being told she snores at night.  Can take naps during the day.  Endorses snacking, trying to cook at home more.  Using hello fresh meal kits.  Social History   Socioeconomic History  . Marital status: Married    Spouse name: Not on file  . Number of children: Not on file  . Years of education: Not on file  . Highest education level: Not on file  Occupational History  . Not on file  Tobacco Use  . Smoking status: Never Smoker  . Smokeless tobacco: Never Used  Vaping Use  . Vaping Use: Never used  Substance and Sexual Activity  . Alcohol use: No  . Drug use: No  . Sexual activity: Yes  Other Topics Concern  . Not on file  Social History Narrative  . Not on file   Social Determinants of Health   Financial Resource Strain:   . Difficulty of Paying Living Expenses: Not on file  Food Insecurity:   . Worried About Charity fundraiser in the Last Year: Not on file  . Ran Out of Food in the Last Year: Not on file  Transportation Needs:   . Lack of Transportation (Medical): Not on file  . Lack of Transportation (Non-Medical): Not on file  Physical Activity:   . Days of Exercise per Week: Not on file  .  Minutes of Exercise per Session: Not on file  Stress:   . Feeling of Stress : Not on file  Social Connections:   . Frequency of Communication with Friends and Family: Not on file  . Frequency of Social Gatherings with Friends and Family: Not on file  . Attends Religious Services: Not on file  . Active Member of Clubs or Organizations: Not on file  . Attends Archivist Meetings: Not on file  . Marital Status: Not on file  Intimate Partner Violence:   . Fear of Current or Ex-Partner: Not on file  . Emotionally Abused: Not on file  . Physically Abused: Not on file  . Sexually Abused: Not on file   Health Maintenance  Topic Date Due  . Hepatitis C Screening  Never done  . COVID-19 Vaccine (1) Never done  . INFLUENZA VACCINE  09/18/2019  . PAP SMEAR-Modifier  09/28/2021  . TETANUS/TDAP  06/03/2028  . HIV Screening  Completed    The following portions of the patient's history were reviewed and updated as appropriate: allergies, current medications, past family history, past medical history, past social history, past surgical history and problem list.  Review of Systems Pertinent items noted in HPI and remainder of comprehensive ROS otherwise negative.  Objective:    BP 110/80 (BP Location: Left Arm, Patient Position: Sitting, Cuff Size: Large)   Pulse 92   Temp 98.1 F (36.7 C) (Oral)   Ht 5\' 10"  (1.778 m)   Wt 255 lb 12.8 oz (116 kg)   LMP 01/13/2020 (Exact Date)   SpO2 99%   BMI 36.70 kg/m  General appearance: alert, cooperative and no distress Head: Normocephalic, without obvious abnormality, atraumatic Eyes: conjunctivae/corneas clear. PERRL, EOM's intact. Fundi benign. Ears: normal TM's and external ear canals both ears Nose: Nares normal. Septum midline. Mucosa normal. No drainage or sinus tenderness. Throat: lips, mucosa, and tongue normal; teeth and gums normal Neck: no adenopathy, no carotid bruit, no JVD, supple, symmetrical, trachea midline and thyroid  not enlarged, symmetric, no tenderness/mass/nodules Lungs: clear to auscultation bilaterally Heart: regular rate and rhythm, S1, S2 normal, no murmur, click, rub or gallop Abdomen: soft, non-tender; bowel sounds normal; no masses,  no organomegaly Extremities: extremities normal, atraumatic, no cyanosis or edema Pulses: 2+ and symmetric Skin: Skin color, texture, turgor normal.  Hyperpigmented area midline chest between breasts.  2 cyst on upper right back, flat, with central hyperpigmented punctum, no drainage noted, no erythema, or surrounding induration.  Tattoo right lateral upper shoulder. Lymph nodes: Cervical, supraclavicular, and axillary nodes normal. Neurologic: Alert and oriented X 3, normal strength and tone. Normal symmetric reflexes. Normal coordination and gait    Assessment:    Healthy female exam with hyperpigmented skin lesion and cyst..      Plan:     Anticipatory guidance given including wearing seatbelts, smoke detectors in the home, increasing physical activity, increasing p.o. intake of water and vegetables. -will obtain labs -mammogram postponed until pt no longer lactating. -pap up to date, done 09/21/18 -colonoscopy not due yet 2/2 age. See After Visit Summary for Counseling Recommendations   Epidermal cyst -Patient interested in removal and referral to Derm - Plan: Ambulatory referral to Dermatology  Dermatitis - Plan: clobetasol ointment (TEMOVATE) 0.05 %  Weight gain -Discussed continuing lifestyle modifications -Patient encouraged to increase physical activity -Given handouts - Plan: TSH, T4, free, Hemoglobin A1c, Lipid panel, Lipid panel, Hemoglobin A1c, T4, free, TSH  Fatigue, unspecified type -We will obtain PHQ-9/GAD-7 -Possibly 2/2 undiagnosed sleep apnea, also consider depression, vitamin deficiency  Snoring -consider sleep study. -weight loss and changing sleep position advised.  Follow-up as needed  Grier Mitts, MD

## 2020-01-19 NOTE — Patient Instructions (Signed)
Preventive Care 41-41 Years Old, Female Preventive care refers to visits with your health care provider and lifestyle choices that can promote health and wellness. This includes:  A yearly physical exam. This may also be called an annual well check.  Regular dental visits and eye exams.  Immunizations.  Screening for certain conditions.  Healthy lifestyle choices, such as eating a healthy diet, getting regular exercise, not using drugs or products that contain nicotine and tobacco, and limiting alcohol use. What can I expect for my preventive care visit? Physical exam Your health care provider will check your:  Height and weight. This may be used to calculate body mass index (BMI), which tells if you are at a healthy weight.  Heart rate and blood pressure.  Skin for abnormal spots. Counseling Your health care provider may ask you questions about your:  Alcohol, tobacco, and drug use.  Emotional well-being.  Home and relationship well-being.  Sexual activity.  Eating habits.  Work and work environment.  Method of birth control.  Menstrual cycle.  Pregnancy history. What immunizations do I need?  Influenza (flu) vaccine  This is recommended every year. Tetanus, diphtheria, and pertussis (Tdap) vaccine  You may need a Td booster every 10 years. Varicella (chickenpox) vaccine  You may need this if you have not been vaccinated. Zoster (shingles) vaccine  You may need this after age 60. Measles, mumps, and rubella (MMR) vaccine  You may need at least one dose of MMR if you were born in 1957 or later. You may also need a second dose. Pneumococcal conjugate (PCV13) vaccine  You may need this if you have certain conditions and were not previously vaccinated. Pneumococcal polysaccharide (PPSV23) vaccine  You may need one or two doses if you smoke cigarettes or if you have certain conditions. Meningococcal conjugate (MenACWY) vaccine  You may need this if you  have certain conditions. Hepatitis A vaccine  You may need this if you have certain conditions or if you travel or work in places where you may be exposed to hepatitis A. Hepatitis B vaccine  You may need this if you have certain conditions or if you travel or work in places where you may be exposed to hepatitis B. Haemophilus influenzae type b (Hib) vaccine  You may need this if you have certain conditions. Human papillomavirus (HPV) vaccine  If recommended by your health care provider, you may need three doses over 6 months. You may receive vaccines as individual doses or as more than one vaccine together in one shot (combination vaccines). Talk with your health care provider about the risks and benefits of combination vaccines. What tests do I need? Blood tests  Lipid and cholesterol levels. These may be checked every 5 years, or more frequently if you are over 41 years old.  Hepatitis C test.  Hepatitis B test. Screening  Lung cancer screening. You may have this screening every year starting at age 41 if you have a 30-pack-year history of smoking and currently smoke or have quit within the past 15 years.  Colorectal cancer screening. All adults should have this screening starting at age 41 and continuing until age 75. Your health care provider may recommend screening at age 41 if you are at increased risk. You will have tests every 1-10 years, depending on your results and the type of screening test.  Diabetes screening. This is done by checking your blood sugar (glucose) after you have not eaten for a while (fasting). You may have this   done every 1-3 years.  Mammogram. This may be done every 1-2 years. Talk with your health care provider about when you should start having regular mammograms. This may depend on whether you have a family history of breast cancer.  BRCA-related cancer screening. This may be done if you have a family history of breast, ovarian, tubal, or peritoneal  cancers.  Pelvic exam and Pap test. This may be done every 3 years starting at age 41. Starting at age 110, this may be done every 5 years if you have a Pap test in combination with an HPV test. Other tests  Sexually transmitted disease (STD) testing.  Bone density scan. This is done to screen for osteoporosis. You may have this scan if you are at high risk for osteoporosis. Follow these instructions at home: Eating and drinking  Eat a diet that includes fresh fruits and vegetables, whole grains, lean protein, and low-fat dairy.  Take vitamin and mineral supplements as recommended by your health care provider.  Do not drink alcohol if: ? Your health care provider tells you not to drink. ? You are pregnant, may be pregnant, or are planning to become pregnant.  If you drink alcohol: ? Limit how much you have to 0-1 drink a day. ? Be aware of how much alcohol is in your drink. In the [place] one drink equals one 12 oz bottle of beer (355 mL), one 5 oz glass of wine (148 mL), or one 1 oz glass of hard liquor (44 mL). Lifestyle  Take daily care of your teeth and gums.  Stay active. Exercise for at least 30 minutes on 5 or more days each week.  Do not use any products that contain nicotine or tobacco, such as cigarettes, e-cigarettes, and chewing tobacco. If you need help quitting, ask your health care provider.  If you are sexually active, practice safe sex. Use a condom or other form of birth control (contraception) in order to prevent pregnancy and STIs (sexually transmitted infections).  If told by your health care provider, take low-dose aspirin daily starting at age 52. What's next?  Visit your health care provider once a year for a well check visit.  Ask your health care provider how often you should have your eyes and teeth checked.  Stay up to date on all vaccines. This information is not intended to replace advice given to you by your health care provider. Make sure you  discuss any questions you have with your health care provider. Document Revised: 10/15/2017 Document Reviewed: 10/15/2017 Elsevier Patient Education  [place].  Epidermal Cyst  An epidermal cyst is a sac made of skin tissue. The sac contains a substance called keratin. Keratin is a protein that is normally secreted through the hair follicles. When keratin becomes trapped in the top layer of skin (epidermis), it can form an epidermal cyst. Epidermal cysts can be found anywhere on your body. These cysts are usually harmless (benign), and they may not cause symptoms unless they become infected. What are the causes? This condition may be caused by:  A blocked hair follicle.  A hair that curls and re-enters the skin instead of growing straight out of the skin (ingrown hair).  A blocked pore.  Irritated skin.  An injury to the skin.  Certain conditions that are passed along from parent to child (inherited).  Human papillomavirus (HPV).  Long-term (chronic) sun damage to the skin. What increases the risk? The following factors may make you more likely  to develop an epidermal cyst:  Having acne.  Being overweight.  Being 61-22 years old. What are the signs or symptoms? The only symptom of this condition may be a small, painless lump underneath the skin. When an epidermal cyst ruptures, it may become infected. Symptoms may include:  Redness.  Inflammation.  Tenderness.  Warmth.  Fever.  Keratin draining from the cyst. Keratin is grayish-white, bad-smelling substance.  Pus draining from the cyst. How is this diagnosed? This condition is diagnosed with a physical exam.  In some cases, you may have a sample of tissue (biopsy) taken from your cyst to be examined under a microscope or tested for bacteria.  You may be referred to a health care provider who specializes in skin care (dermatologist). How is this treated? In many cases, epidermal cysts go away on their  own without treatment. If a cyst becomes infected, treatment may include:  Opening and draining the cyst, done by a health care provider. After draining, minor surgery to remove the rest of the cyst may be done.  Antibiotic medicine.  Injections of medicines (steroids) that help to reduce inflammation.  Surgery to remove the cyst. Surgery may be done if the cyst: ? Becomes large. ? Bothers you. ? Has a chance of turning into cancer.  Do not try to open a cyst yourself. Follow these instructions at home:  Take over-the-counter and prescription medicines only as told by your health care provider.  If you were prescribed an antibiotic medicine, take it it as told by your health care provider. Do not stop using the antibiotic even if you start to feel better.  Keep the area around your cyst clean and dry.  Wear loose, dry clothing.  Avoid touching your cyst.  Check your cyst every day for signs of infection. Check for: ? Redness, swelling, or pain. ? Fluid or blood. ? Warmth. ? Pus or a bad smell.  Keep all follow-up visits as told by your health care provider. This is important. How is this prevented?  Wear clean, dry, clothing.  Avoid wearing tight clothing.  Keep your skin clean and dry. Take showers or baths every day. Contact a health care provider if:  Your cyst develops symptoms of infection.  Your condition is not improving or is getting worse.  You develop a cyst that looks different from other cysts you have had.  You have a fever. Get help right away if:  Redness spreads from the cyst into the surrounding area. Summary  An epidermal cyst is a sac made of skin tissue. These cysts are usually harmless (benign), and they may not cause symptoms unless they become infected.  If a cyst becomes infected, treatment may include surgery to open and drain the cyst, or to remove it. Treatment may also include medicines by mouth or through an injection.  Take  over-the-counter and prescription medicines only as told by your health care provider. If you were prescribed an antibiotic medicine, take it as told by your health care provider. Do not stop using the antibiotic even if you start to feel better.  Contact a health care provider if your condition is not improving or is getting worse.  Keep all follow-up visits as told by your health care provider. This is important. This information is not intended to replace advice given to you by your health care provider. Make sure you discuss any questions you have with your health care provider. Document Revised: 05/27/2018 Document Reviewed: 08/17/2017 Elsevier Patient Education  2020 [employer]  Epidermal Cyst Removal  Epidermal cyst removal is a procedure to remove a sac of oily material (keratin) that has formed under your skin (epidermal cyst). Epidermal cysts may also be called epidermoid cysts, sebaceous cysts, or keratin cysts. Normally, the skin secretes this oily material through a gland or a hair follicle. However, when a skin gland or hair follicle becomes blocked, an epidermal cyst can form. You may need this procedure if you have an epidermal cyst that becomes large, uncomfortable, or infected. Tell a health care provider about:  Any allergies you have.  All medicines you are taking, including vitamins, herbs, eye drops, creams, and over-the-counter medicines.  Any problems you or family members have had with anesthetic medicines.  Any blood disorders you have.  Any surgeries you have had.  Any medical conditions you have now or have had.  Whether you are pregnant or may be pregnant. What are the risks? Generally, this is a safe procedure. However, problems may occur, including:  Development of another cyst.  Bleeding.  Infection.  Scarring. What happens before the procedure?  Ask your health care provider about: ? Changing or stopping your regular medicines. This is  especially important if you are taking diabetes medicines or blood thinners. ? Taking medicines such as aspirin and ibuprofen. These medicines can thin your blood. Do not take these medicines unless your health care provider tells you to take them. ? Taking over-the-counter medicines, vitamins, herbs, and supplements.  If you have an infected cyst, you may have to take antibiotic medicine before the cyst removal. Take your antibiotic as told by your health care provider. Do not stop taking the antibiotic even if you start to feel better.  Take a shower on the morning of your procedure. Your health care provider may ask you to use a germ-killing soap. What happens during the procedure?   You will be given a medicine to numb the area (local anesthetic).  The skin around the cyst will be cleaned with a germ-killing solution.  The health care provider will make a small incision in your skin over the cyst.  The health care provider will separate the cyst from the surrounding tissues that are under your skin.  If possible, the cyst will be removed undamaged (intact).  If the cyst bursts (ruptures), it will be removed in pieces.  After the cyst is removed, the health care provider will control any bleeding and close the incision with small stitches (sutures). Small incisions may not need sutures, and the bleeding will be controlled by applying direct pressure with gauze.  The health care provider may apply antibiotic ointment and a bandage (dressing) over the incision. The procedure may vary among health care providers and hospitals. What happens after the procedure?  If your cyst ruptured during the procedure, you may need an antibiotic. If you are prescribed an antibiotic medicine or ointment, take or apply it as told by your health care provider. Do not stop using the antibiotic even if you start to feel better. Summary  Epidermal cyst removal is a procedure to remove a sac of oily  material (keratin) that has formed under your skin (epidermal cyst).  You may need this procedure if you have an epidermal cyst that becomes large, uncomfortable, or infected.  The health care provider will make a small incision in your skin to remove the cyst.  If you are prescribed an antibiotic medicine before the procedure, after the procedure, or both, use the  antibiotic as told by your health care provider. Do not stop using the antibiotic even if you start to feel better. This information is not intended to replace advice given to you by your health care provider. Make sure you discuss any questions you have with your health care provider. Document Revised: 05/27/2018 Document Reviewed: 11/27/2016 Elsevier Patient Education  2020 [place] After pregnancy, your breasts fill with milk (engorgement). You may choose to stop (suppress) your milk supply if you need to stop breastfeeding quickly, if you are unable to breastfeed, or if you choose not to breastfeed. There are many reasons for not breastfeeding, including personal choice, pregnancy loss or miscarriage, or if you have certain medical conditions or take medicines that do not allow you to breastfeed. If you do not let out (express) milk regularly, your breast milk production will gradually slow down and stop. This may take several days or more than a week. During this time, your breasts will feel full and may be uncomfortable. They may also leak some milk. There are medicines to help suppress milk production, but there is no scientific evidence to show that taking medicine is better than letting breast milk production stop naturally. Medicines may have side effects. Talk with your health care provider about the risks and benefits of these medicines. Follow these instructions at home: Managing pain, swelling, and discomfort   Take over-the-counter and prescription medicines only as told by your health  care provider. Ask your health care provider if you may take an over-the-counter pain reliever for discomfort from breast engorgement.  Wear a supportive bra during the day and at night.  Use breast pads inside your bra to soak up leaks.  If directed, put ice on your breasts: ? Put ice in a plastic bag. ? Place a towel between your bra and the bag. ? Leave the ice on for 20 minutes, 2-3 times a day.  Try cabbage leaf compresses to cool and soothe your breast. Keep a cabbage in the refrigerator. Peel off a large leaf and place it inside your bra. Replace it after the leaf warms and wilts.  To relieve discomfort in bed, lie on your side and use a pillow to support your breasts.  If your breasts are very full and you are very uncomfortable, consider expressing a little milk to relieve some pressure. Only express a small amount of milk, so that you do not stimulate your breasts to make more milk. Generally, this should provide relief within 3-5 minutes of expressing milk. Once you feel relief, stop expressing milk. General instructions  Breast engorgement may raise your risk of a breast infection (mastitis). Watch for signs of mastitis, such as: ? Pain, swelling, or redness. ? A tender lump. ? Fever. ? Chills. ? Fatigue.  Return to your normal activities as told by your health care provider. Ask your health care provider what activities are safe for you. Avoid activities that cause breast discomfort.  Keep all follow-up visits as told by your health care provider. This is important. Contact a health care provider if:  You continue to have breast engorgement, leaking, and discomfort at home.  You have any signs of mastitis. Summary  Breast milk suppression will happen naturally if you do not breastfeed or express milk regularly.  You may have breast fullness, leaking, and discomfort for several days.  A supportive bra, over-the-counter pain relievers, and frequent cool compresses on  your breasts may help relieve discomfort.  Let your health care provider know if you have any signs of mastitis, such as pain, swelling, or redness. This information is not intended to replace advice given to you by your health care provider. Make sure you discuss any questions you have with your health care provider. Document Revised: 09/22/2016 Document Reviewed: 09/22/2016 Elsevier Patient Education  [place].

## 2020-01-20 LAB — CBC WITH DIFFERENTIAL/PLATELET
Absolute Monocytes: 221 cells/uL (ref 200–950)
Basophils Absolute: 29 cells/uL (ref 0–200)
Basophils Relative: 0.6 %
Eosinophils Absolute: 69 cells/uL (ref 15–500)
Eosinophils Relative: 1.4 %
HCT: 34.2 % — ABNORMAL LOW (ref 35.0–45.0)
Hemoglobin: 10.7 g/dL — ABNORMAL LOW (ref 11.7–15.5)
Lymphs Abs: 1975 cells/uL (ref 850–3900)
MCH: 24.8 pg — ABNORMAL LOW (ref 27.0–33.0)
MCHC: 31.3 g/dL — ABNORMAL LOW (ref 32.0–36.0)
MCV: 79.4 fL — ABNORMAL LOW (ref 80.0–100.0)
MPV: 11 fL (ref 7.5–12.5)
Monocytes Relative: 4.5 %
Neutro Abs: 2607 cells/uL (ref 1500–7800)
Neutrophils Relative %: 53.2 %
Platelets: 207 10*3/uL (ref 140–400)
RBC: 4.31 10*6/uL (ref 3.80–5.10)
RDW: 14.8 % (ref 11.0–15.0)
Total Lymphocyte: 40.3 %
WBC: 4.9 10*3/uL (ref 3.8–10.8)

## 2020-01-20 LAB — HEMOGLOBIN A1C
Hgb A1c MFr Bld: 5.7 % of total Hgb — ABNORMAL HIGH (ref <5.7)
Mean Plasma Glucose: 117 (calc)
eAG (mmol/L): 6.5 (calc)

## 2020-01-20 LAB — BASIC METABOLIC PANEL WITH GFR
BUN: 11 mg/dL (ref 7–25)
CO2: 25 mmol/L (ref 20–32)
Calcium: 9.4 mg/dL (ref 8.6–10.2)
Chloride: 107 mmol/L (ref 98–110)
Creat: 0.78 mg/dL (ref 0.50–1.10)
GFR, Est African American: 109 mL/min/{1.73_m2} (ref >=60)
GFR, Est Non African American: 94 mL/min/{1.73_m2} (ref >=60)
Glucose, Bld: 98 mg/dL (ref 65–99)
Potassium: 4.4 mmol/L (ref 3.5–5.3)
Sodium: 144 mmol/L (ref 135–146)

## 2020-01-20 LAB — LIPID PANEL
Cholesterol: 179 mg/dL (ref <200)
HDL: 48 mg/dL — ABNORMAL LOW (ref >=50)
LDL Cholesterol (Calc): 109 mg/dL (calc) — ABNORMAL HIGH
Non-HDL Cholesterol (Calc): 131 mg/dL (calc) — ABNORMAL HIGH (ref <130)
Total CHOL/HDL Ratio: 3.7 (calc) (ref <5.0)
Triglycerides: 108 mg/dL (ref <150)

## 2020-01-20 LAB — T4, FREE: Free T4: 1 ng/dL (ref 0.8–1.8)

## 2020-01-20 LAB — TSH: TSH: 1.94 mIU/L

## 2020-04-13 ENCOUNTER — Encounter: Payer: Self-pay | Admitting: Family Medicine

## 2020-05-03 ENCOUNTER — Telehealth: Payer: Self-pay | Admitting: Family Medicine

## 2020-05-03 NOTE — Telephone Encounter (Signed)
Okay for referral?

## 2020-05-03 NOTE — Telephone Encounter (Signed)
[name] with [facility] is calling in stating that the pt has called them wanting to make an appointment, but before they can make an appointment they are needing a referral, last office notes and labs faxed to [phone]).

## 2020-05-04 NOTE — Telephone Encounter (Signed)
Initial phone note fails to mention place patient would like to be referred to and the reason for the visit.

## 2020-05-08 NOTE — Telephone Encounter (Signed)
Spoke with pt, she is wanting to go to [facility] [place]. States she is still producing milk since breastfeeding, also happened with previous child, stated it took years to completely dry up/out.

## 2020-05-11 NOTE — Telephone Encounter (Signed)
Okay 

## 2020-05-14 NOTE — Addendum Note (Signed)
Addended by: Anderson Malta on: 05/14/2020 09:08 AM   Modules accepted: Orders

## 2020-05-14 NOTE — Telephone Encounter (Signed)
Referral placed.

## 2020-05-15 ENCOUNTER — Encounter: Payer: Self-pay | Admitting: Family Medicine

## 2020-06-12 ENCOUNTER — Other Ambulatory Visit (HOSPITAL_BASED_OUTPATIENT_CLINIC_OR_DEPARTMENT_OTHER): Payer: Self-pay

## 2020-06-12 ENCOUNTER — Telehealth (INDEPENDENT_AMBULATORY_CARE_PROVIDER_SITE_OTHER): Payer: Self-pay | Admitting: Family Medicine

## 2020-06-12 DIAGNOSIS — J40 Bronchitis, not specified as acute or chronic: Secondary | ICD-10-CM

## 2020-06-12 MED ORDER — AZITHROMYCIN 250 MG PO TABS
ORAL_TABLET | ORAL | 0 refills | Status: DC
  Filled 2020-06-12: qty 6, 5d supply, fill #0

## 2020-06-12 NOTE — Progress Notes (Signed)
Subjective:    Patient ID: Sandra Roberts, female    DOB: 09-22-1980, 40 y.o.   MRN: [id number]  HPI Virtual Visit via Video Note  I connected with the patient on 06/12/20 at  2:00 PM EDT by a video enabled telemedicine application and verified that I am speaking with the correct person using two identifiers.  Location patient: home Location provider:work or home office Persons participating in the virtual visit: patient, provider  I discussed the limitations of evaluation and management by telemedicine and the availability of in person appointments. The patient expressed understanding and agreed to proceed.   HPI: Here for 4 days of PND, ST, chest congestion and coughing up yellow sputum. No chest pain or SOB. No body aches or NVD. Her son is being treated for an ear infection. She tested negative for the Covid virus 2 days ago. Drinking fluids and taking Mucinex.    ROS: See pertinent positives and negatives per HPI.  Past Medical History:  Diagnosis Date  . H/O cold sores   . Varicella     Past Surgical History:  Procedure Laterality Date  . CESAREAN SECTION    . ECTOPIC PREGNANCY SURGERY      Family History  Problem Relation Age of Onset  . Hypertension Mother   . Cancer Maternal Grandmother   . Hearing loss Maternal Grandmother   . Hypertension Maternal Grandmother   . Pulmonary embolism Maternal Grandmother   . Cancer Father   . Asthma Daughter   . Hearing loss Paternal Grandmother   . Heart disease Paternal Grandmother   . Hypertension Paternal Grandmother   . Hyperlipidemia Paternal Grandmother   . Stroke Paternal Grandmother   . Hearing loss Paternal Grandfather   . Heart disease Paternal Grandfather   . Hyperlipidemia Paternal Grandfather   . Hypertension Paternal Grandfather   . Stroke Paternal Grandfather      Current Outpatient Medications:  .  azithromycin (ZITHROMAX Z-PAK) 250 MG tablet, As directed, Disp: 6 each, Rfl: 0 .  clobetasol  ointment (TEMOVATE) 2.53 %, APPLY 1 APPLICATION TOPICALLY 2 TIMES DAILY., Disp: 45 g, Rfl: 1 .  Prenatal Vit-Fe Fumarate-FA (PRENATAL MULTIVITAMIN) TABS tablet, Take 1 tablet by mouth daily. , Disp: , Rfl:   EXAM:  VITALS per patient if applicable:  GENERAL: alert, oriented, appears well and in no acute distress  HEENT: atraumatic, conjunttiva clear, no obvious abnormalities on inspection of external nose and ears  NECK: normal movements of the head and neck  LUNGS: on inspection no signs of respiratory distress, breathing rate appears normal, no obvious gross SOB, gasping or wheezing  CV: no obvious cyanosis  MS: moves all visible extremities without noticeable abnormality  PSYCH/NEURO: pleasant and cooperative, no obvious depression or anxiety, speech and thought processing grossly intact  ASSESSMENT AND PLAN: Bronchitis, treat with a Zpack. Recheck as needed.  Alysia Penna, MD  Discussed the following assessment and plan:  No diagnosis found.     I discussed the assessment and treatment plan with the patient. The patient was provided an opportunity to ask questions and all were answered. The patient agreed with the plan and demonstrated an understanding of the instructions.   The patient was advised to call back or seek an in-person evaluation if the symptoms worsen or if the condition fails to improve as anticipated.     Review of Systems     Objective:   Physical Exam        Assessment & Plan:

## 2020-06-19 ENCOUNTER — Ambulatory Visit: Payer: Self-pay | Attending: Internal Medicine

## 2020-06-19 DIAGNOSIS — Z23 Encounter for immunization: Secondary | ICD-10-CM

## 2020-06-19 NOTE — Progress Notes (Signed)
   Covid-19 Vaccination Clinic  Name:  Sandra Roberts    MRN: [id number] DOB: [birth year]  06/19/2020  Sandra Roberts was observed post Covid-19 immunization for 15 minutes without incident. She was provided with Vaccine Information Sheet and instruction to access the V-Safe system.   Sandra Roberts was instructed to call 911 with any severe reactions post vaccine: Marland Kitchen Difficulty breathing  . Swelling of face and throat  . A fast heartbeat  . A bad rash all over body  . Dizziness and weakness   Immunizations Administered    Name Date Dose VIS Date Route   PFIZER Comrnaty(Gray TOP) Covid-19 Vaccine 06/19/2020  2:53 PM 0.3 mL 01/26/2020 Intramuscular   Manufacturer: [employer]   Lot: [id number]   NDC: [phone]

## 2020-06-26 ENCOUNTER — Other Ambulatory Visit (HOSPITAL_BASED_OUTPATIENT_CLINIC_OR_DEPARTMENT_OTHER): Payer: Self-pay

## 2020-06-26 MED ORDER — PFIZER-BIONT COVID-19 VAC-TRIS 30 MCG/0.3ML IM SUSP
INTRAMUSCULAR | 0 refills | Status: DC
Start: 2020-06-19 — End: 2023-03-02
  Filled 2020-06-26: qty 0.3, 1d supply, fill #0

## 2020-09-10 ENCOUNTER — Other Ambulatory Visit (HOSPITAL_BASED_OUTPATIENT_CLINIC_OR_DEPARTMENT_OTHER): Payer: Self-pay

## 2020-09-10 ENCOUNTER — Other Ambulatory Visit: Payer: Self-pay

## 2020-09-10 ENCOUNTER — Ambulatory Visit
Admission: EM | Admit: 2020-09-10 | Discharge: 2020-09-10 | Disposition: A | Discharge status: Home / Self Care | Payer: 59 | Attending: Emergency Medicine | Admitting: Emergency Medicine

## 2020-09-10 ENCOUNTER — Encounter: Payer: Self-pay | Admitting: Emergency Medicine

## 2020-09-10 DIAGNOSIS — T22112A Burn of first degree of left forearm, initial encounter: Secondary | ICD-10-CM

## 2020-09-10 MED ORDER — SILVER SULFADIAZINE 1 % EX CREA
1.0000 "application " | TOPICAL_CREAM | Freq: Every day | CUTANEOUS | 0 refills | Status: DC
  Filled 2020-09-10: qty 50, 30d supply, fill #0

## 2020-09-10 MED ORDER — IBUPROFEN 800 MG PO TABS
800.0000 mg | ORAL_TABLET | Freq: Three times a day (TID) | ORAL | 0 refills | Status: DC
  Filled 2020-09-10: qty 21, 7d supply, fill #0

## 2020-09-10 MED ORDER — MUPIROCIN 2 % EX OINT
1.0000 "application " | TOPICAL_OINTMENT | Freq: Two times a day (BID) | CUTANEOUS | 0 refills | Status: DC
  Filled 2020-09-10: qty 30, 15d supply, fill #0
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
## 2020-09-10 NOTE — Discharge Instructions (Addendum)
Please use Tylenol [phone] mg every 4-6 hours, may supplement with Ibuprofen 600 mg every 8 hours May apply aloe vera for further relief/healing May apply wet gauze to keep cool Do not apply ice Silvadene once daily, Bactroban twice daily, otherwise let wound air out and avoid moisture Please return for any signs of infection or symptoms not healing

## 2020-09-11 ENCOUNTER — Other Ambulatory Visit (HOSPITAL_BASED_OUTPATIENT_CLINIC_OR_DEPARTMENT_OTHER): Payer: Self-pay
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
## 2020-09-14 ENCOUNTER — Emergency Department (HOSPITAL_BASED_OUTPATIENT_CLINIC_OR_DEPARTMENT_OTHER)
Admission: EM | Admit: 2020-09-14 | Discharge: 2020-09-15 | Disposition: A | Discharge status: Home / Self Care | Payer: 59 | Attending: Emergency Medicine | Admitting: Emergency Medicine

## 2020-09-14 ENCOUNTER — Encounter (HOSPITAL_BASED_OUTPATIENT_CLINIC_OR_DEPARTMENT_OTHER): Payer: Self-pay

## 2020-09-14 ENCOUNTER — Other Ambulatory Visit: Payer: Self-pay

## 2020-09-14 DIAGNOSIS — G44209 Tension-type headache, unspecified, not intractable: Secondary | ICD-10-CM

## 2020-09-14 DIAGNOSIS — S8002XD Contusion of left knee, subsequent encounter: Secondary | ICD-10-CM | POA: Insufficient documentation

## 2020-09-14 DIAGNOSIS — S8001XD Contusion of right knee, subsequent encounter: Secondary | ICD-10-CM

## 2020-09-14 DIAGNOSIS — Y92410 Unspecified street and highway as the place of occurrence of the external cause: Secondary | ICD-10-CM | POA: Diagnosis not present

## 2020-09-14 DIAGNOSIS — M542 Cervicalgia: Secondary | ICD-10-CM | POA: Diagnosis not present

## 2020-09-14 DIAGNOSIS — S8992XD Unspecified injury of left lower leg, subsequent encounter: Secondary | ICD-10-CM | POA: Diagnosis present

## 2020-09-14 DIAGNOSIS — M25561 Pain in right knee: Secondary | ICD-10-CM | POA: Insufficient documentation

## 2020-09-14 MED ORDER — PROCHLORPERAZINE MALEATE 10 MG PO TABS
10.0000 mg | ORAL_TABLET | Freq: Once | ORAL | Status: AC
  Administered 2020-09-14: 10 mg via ORAL
  Filled 2020-09-14: qty 1

## 2020-09-14 MED ORDER — ACETAMINOPHEN 500 MG PO TABS
1000.0000 mg | ORAL_TABLET | Freq: Once | ORAL | Status: AC
  Administered 2020-09-14: 1000 mg via ORAL
  Filled 2020-09-14: qty 2
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
## 2020-10-02 ENCOUNTER — Other Ambulatory Visit: Payer: Self-pay

## 2020-10-03 ENCOUNTER — Ambulatory Visit (INDEPENDENT_AMBULATORY_CARE_PROVIDER_SITE_OTHER): Payer: 59 | Admitting: Family Medicine

## 2020-10-03 ENCOUNTER — Encounter: Payer: Self-pay | Admitting: Family Medicine

## 2020-10-03 VITALS — BP 128/84 | HR 94 | Temp 98.5°F | Ht 69.0 in | Wt 224.4 lb

## 2020-10-03 DIAGNOSIS — N643 Galactorrhea not associated with childbirth: Secondary | ICD-10-CM

## 2020-10-03 DIAGNOSIS — Z6833 Body mass index (BMI) 33.0-33.9, adult: Secondary | ICD-10-CM

## 2020-10-03 DIAGNOSIS — E6609 Other obesity due to excess calories: Secondary | ICD-10-CM | POA: Diagnosis not present

## 2020-10-03 DIAGNOSIS — Z0000 Encounter for general adult medical examination without abnormal findings: Secondary | ICD-10-CM | POA: Diagnosis not present

## 2020-10-03 LAB — COMPREHENSIVE METABOLIC PANEL
ALT: 12 U/L (ref 0–35)
AST: 14 U/L (ref 0–37)
Albumin: 4 g/dL (ref 3.5–5.2)
Alkaline Phosphatase: 65 U/L (ref 39–117)
BUN: 7 mg/dL (ref 6–23)
CO2: 27 mEq/L (ref 19–32)
Calcium: 9.2 mg/dL (ref 8.4–10.5)
Chloride: 103 mEq/L (ref 96–112)
Creatinine, Ser: 0.79 mg/dL (ref 0.40–1.20)
GFR: 92.22 mL/min (ref >60.00)
Glucose, Bld: 93 mg/dL (ref 70–99)
Potassium: 4.4 mEq/L (ref 3.5–5.1)
Sodium: 138 mEq/L (ref 135–145)
Total Bilirubin: 0.4 mg/dL (ref 0.2–1.2)
Total Protein: 6.7 g/dL (ref 6.0–8.3)

## 2020-10-03 LAB — LIPID PANEL
Cholesterol: 180 mg/dL (ref 0–200)
HDL: 45.5 mg/dL (ref >39.00)
LDL Cholesterol: 114 mg/dL — ABNORMAL HIGH (ref 0–99)
NonHDL: 134.35
Total CHOL/HDL Ratio: 4
Triglycerides: 100 mg/dL (ref 0.0–149.0)
VLDL: 20 mg/dL (ref 0.0–40.0)

## 2020-10-03 LAB — HEMOGLOBIN A1C: Hgb A1c MFr Bld: 5.7 % (ref 4.6–6.5)

## 2020-10-03 LAB — VITAMIN D 25 HYDROXY (VIT D DEFICIENCY, FRACTURES): VITD: 23.3 ng/mL — ABNORMAL LOW (ref 30.00–100.00)

## 2020-10-03 LAB — VITAMIN B12: Vitamin B-12: 222 pg/mL (ref 211–911)

## 2020-10-03 LAB — T4, FREE: Free T4: 0.86 ng/dL (ref 0.60–1.60)

## 2020-10-03 LAB — TSH: TSH: 3.34 u[IU]/mL (ref 0.35–5.50)
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
## 2020-10-04 LAB — CBC WITH DIFFERENTIAL/PLATELET
Basophils Absolute: 0 10*3/uL (ref 0.0–0.1)
Basophils Relative: 1 % (ref 0.0–3.0)
Eosinophils Absolute: 0.2 10*3/uL (ref 0.0–0.7)
Eosinophils Relative: 3.8 % (ref 0.0–5.0)
HCT: 34.7 % — ABNORMAL LOW (ref 36.0–46.0)
Hemoglobin: 11.1 g/dL — ABNORMAL LOW (ref 12.0–15.0)
Lymphocytes Relative: 36.4 % (ref 12.0–46.0)
Lymphs Abs: 1.7 10*3/uL (ref 0.7–4.0)
MCHC: 31.9 g/dL (ref 30.0–36.0)
MCV: 79.4 fl (ref 78.0–100.0)
Monocytes Absolute: 0.2 10*3/uL (ref 0.1–1.0)
Monocytes Relative: 3.3 % (ref 3.0–12.0)
Neutro Abs: 2.6 10*3/uL (ref 1.4–7.7)
Neutrophils Relative %: 55.5 % (ref 43.0–77.0)
Platelets: 361.9 10*3/uL (ref 150.0–400.0)
RBC: 4.37 Mil/uL (ref 3.87–5.11)
RDW: 16.4 % — ABNORMAL HIGH (ref 11.5–15.5)
WBC: 5.2 10*3/uL (ref 4.0–10.5)

## 2020-10-04 LAB — PROLACTIN: Prolactin: 11 ng/mL

## 2020-10-08 ENCOUNTER — Other Ambulatory Visit: Payer: Self-pay | Admitting: Family Medicine

## 2020-10-08 DIAGNOSIS — D509 Iron deficiency anemia, unspecified: Secondary | ICD-10-CM

## 2020-10-08 DIAGNOSIS — E559 Vitamin D deficiency, unspecified: Secondary | ICD-10-CM

## 2020-10-08 MED ORDER — FERROUS GLUCONATE 324 (38 FE) MG PO TABS
324.0000 mg | ORAL_TABLET | Freq: Every day | ORAL | 1 refills | Status: DC
Start: 2020-10-08 — End: 2023-03-02
  Filled 2020-10-08: qty 30, 30d supply, fill #0

## 2020-10-08 MED ORDER — VITAMIN D (ERGOCALCIFEROL) 1.25 MG (50000 UNIT) PO CAPS
50000.0000 [IU] | ORAL_CAPSULE | ORAL | 0 refills | Status: DC
  Filled 2020-10-08 – 2020-10-11 (×2): qty 4, 28d supply, fill #0

## 2020-10-09 ENCOUNTER — Other Ambulatory Visit (HOSPITAL_COMMUNITY): Payer: Self-pay

## 2020-10-11 ENCOUNTER — Other Ambulatory Visit (HOSPITAL_COMMUNITY): Payer: Self-pay

## 2020-10-11 ENCOUNTER — Other Ambulatory Visit (HOSPITAL_BASED_OUTPATIENT_CLINIC_OR_DEPARTMENT_OTHER): Payer: Self-pay

## 2020-10-16 ENCOUNTER — Telehealth: Payer: Self-pay

## 2020-10-16 ENCOUNTER — Other Ambulatory Visit (HOSPITAL_BASED_OUTPATIENT_CLINIC_OR_DEPARTMENT_OTHER): Payer: Self-pay

## 2020-10-16 NOTE — Telephone Encounter (Signed)
Patient called asking if she could be placed back on Vyvanse

## 2020-10-29 NOTE — Telephone Encounter (Signed)
Called pt, no answer, per FYI left vm. Advised pt that Dr Volanda Napoleon wants her follow up with provider who prescribed medication.

## 2020-10-29 NOTE — Telephone Encounter (Signed)
PT is still waiting to hear back about the Vyvanse. Please advise.

## 2020-10-31 ENCOUNTER — Telehealth: Payer: Self-pay

## 2020-10-31 NOTE — Telephone Encounter (Signed)
Patient called requesting a referral for a mammogram pt would like a call back to discuss

## 2020-11-02 NOTE — Telephone Encounter (Signed)
Spoke with patient, has not had a mammogram before but wants to get it done. Gave options and number to [facility]. Informed her if she has any issues or other concerns to call us.

## 2020-11-15 ENCOUNTER — Other Ambulatory Visit (HOSPITAL_BASED_OUTPATIENT_CLINIC_OR_DEPARTMENT_OTHER): Payer: Self-pay

## 2020-11-15 MED ORDER — INFLUENZA VAC SPLIT QUAD 0.5 ML IM SUSY
PREFILLED_SYRINGE | INTRAMUSCULAR | 0 refills | Status: DC
  Filled 2020-11-15: qty 0.5, 1d supply, fill #0

## 2020-12-11 LAB — HM MAMMOGRAPHY: HM Mammogram: NORMAL (ref 0–4)

## 2020-12-13 ENCOUNTER — Encounter: Payer: Self-pay | Admitting: Family Medicine

## 2020-12-14 ENCOUNTER — Other Ambulatory Visit: Payer: Self-pay | Admitting: Obstetrics and Gynecology

## 2020-12-14 DIAGNOSIS — R928 Other abnormal and inconclusive findings on diagnostic imaging of breast: Secondary | ICD-10-CM

## 2021-01-16 ENCOUNTER — Other Ambulatory Visit: Payer: Self-pay

## 2021-01-16 ENCOUNTER — Ambulatory Visit
Admission: RE | Admit: 2021-01-16 | Discharge: 2021-01-16 | Disposition: A | Discharge status: Home / Self Care | Payer: 59 | Source: Ambulatory Visit | Attending: Obstetrics and Gynecology | Admitting: Obstetrics and Gynecology

## 2021-01-16 DIAGNOSIS — R928 Other abnormal and inconclusive findings on diagnostic imaging of breast: Secondary | ICD-10-CM

## 2021-12-04 ENCOUNTER — Other Ambulatory Visit (HOSPITAL_BASED_OUTPATIENT_CLINIC_OR_DEPARTMENT_OTHER): Payer: Self-pay

## 2021-12-04 MED ORDER — AZITHROMYCIN 250 MG PO TABS
ORAL_TABLET | ORAL | 0 refills | Status: DC
  Filled 2021-12-04: qty 6, 5d supply, fill #0

## 2021-12-26 ENCOUNTER — Encounter: Payer: Self-pay | Admitting: Family Medicine

## 2021-12-26 LAB — HM PAP SMEAR: HM Pap smear: ABNORMAL

## 2022-01-03 ENCOUNTER — Other Ambulatory Visit: Payer: Self-pay | Admitting: Obstetrics and Gynecology

## 2022-01-03 DIAGNOSIS — R928 Other abnormal and inconclusive findings on diagnostic imaging of breast: Secondary | ICD-10-CM

## 2022-01-21 ENCOUNTER — Ambulatory Visit: Admission: RE | Admit: 2022-01-21 | Payer: 59 | Source: Ambulatory Visit

## 2022-01-21 ENCOUNTER — Ambulatory Visit
Admission: RE | Admit: 2022-01-21 | Discharge: 2022-01-21 | Disposition: A | Discharge status: Home / Self Care | Payer: 59 | Source: Ambulatory Visit | Attending: Obstetrics and Gynecology | Admitting: Obstetrics and Gynecology

## 2022-01-21 DIAGNOSIS — R928 Other abnormal and inconclusive findings on diagnostic imaging of breast: Secondary | ICD-10-CM

## 2022-04-03 ENCOUNTER — Other Ambulatory Visit (HOSPITAL_BASED_OUTPATIENT_CLINIC_OR_DEPARTMENT_OTHER): Payer: Self-pay

## 2022-04-03 MED ORDER — LO LOESTRIN FE 1 MG-10 MCG / 10 MCG PO TABS
1.0000 | ORAL_TABLET | Freq: Every day | ORAL | 4 refills | Status: DC
  Filled 2022-04-03: qty 84, 84d supply, fill #0

## 2022-07-22 IMAGING — US US BREAST*L* LIMITED INC AXILLA
1 series · 7 of 7 positions shown · non-contrast
Comparison: 12/11/2020

CLINICAL DATA: Screening recall for a possible left breast mass.
Screening exam was the baseline study.

EXAM:
DIGITAL DIAGNOSTIC UNILATERAL LEFT MAMMOGRAM WITH TOMOSYNTHESIS AND
CAD; ULTRASOUND LEFT BREAST LIMITED
TECHNIQUE: Left digital diagnostic mammography and breast tomosynthesis was
performed. The images were evaluated with computer-aided detection.;
Targeted ultrasound examination of the left breast was performed.

[Series 1: us breast*left* limited inc axilla · 0.05mm/px · 7 of 7 slices shown]
[im 1/7]
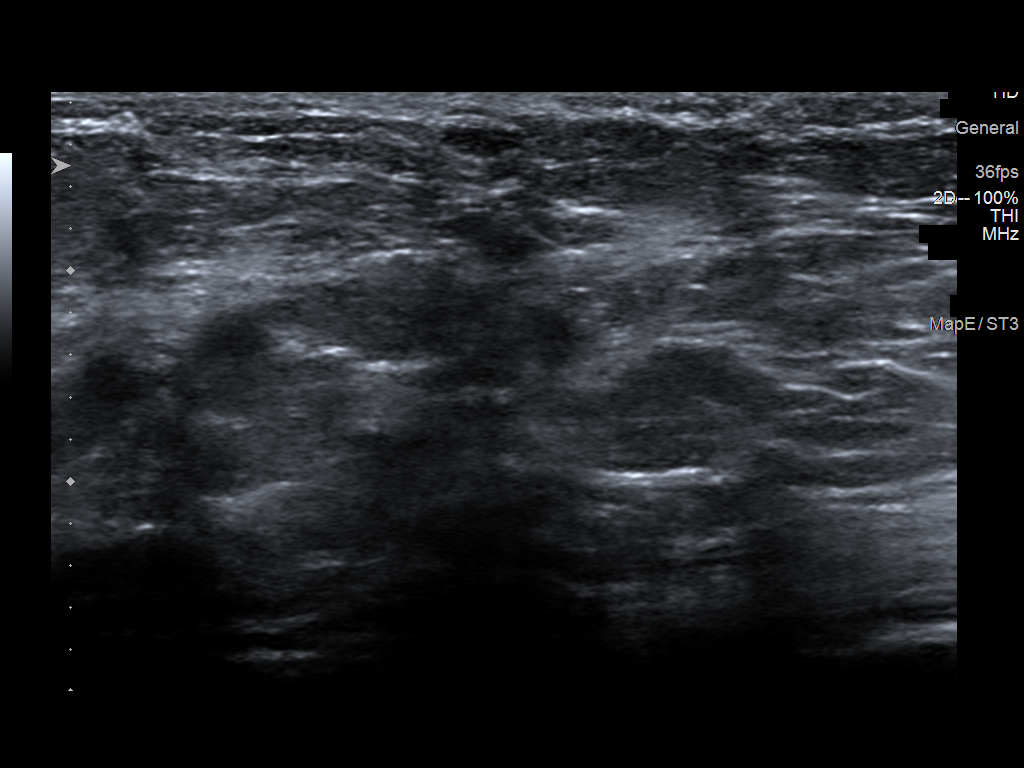
[im 2/7]
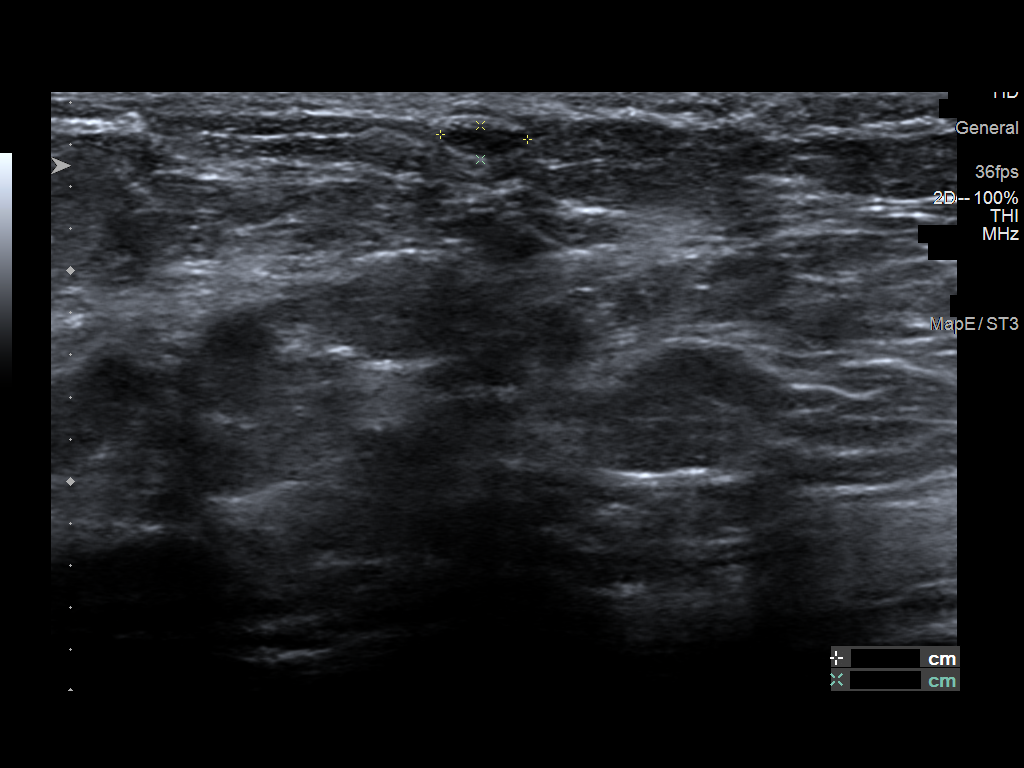
[im 3/7]
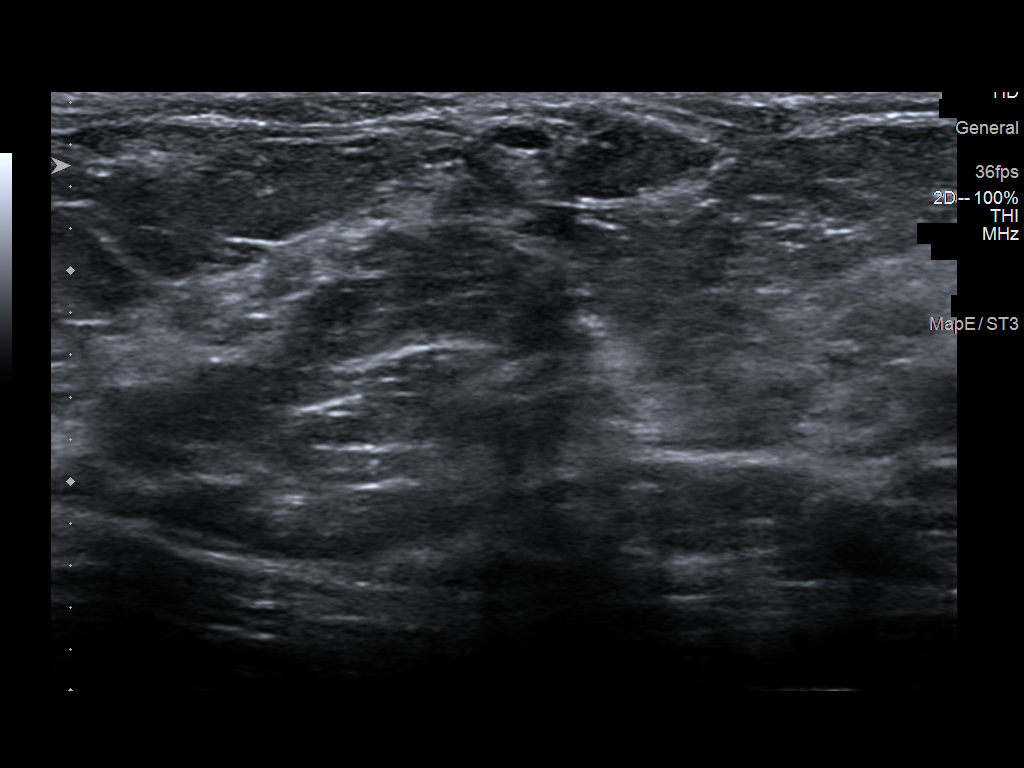
[im 4/7]
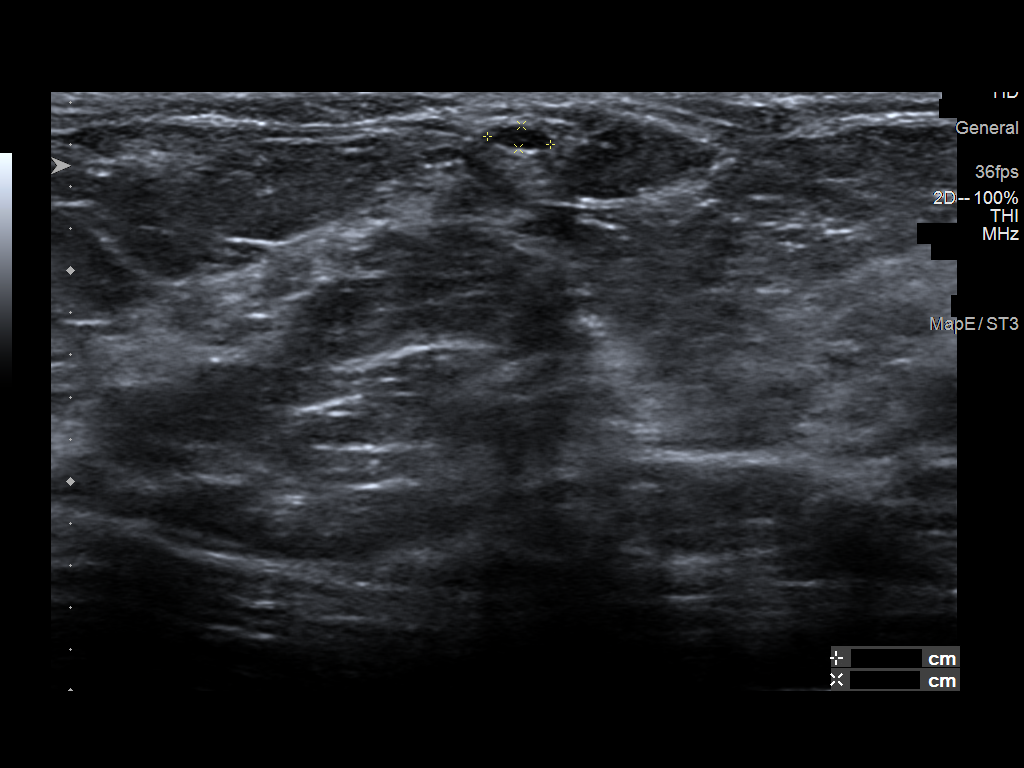
[im 5/7]
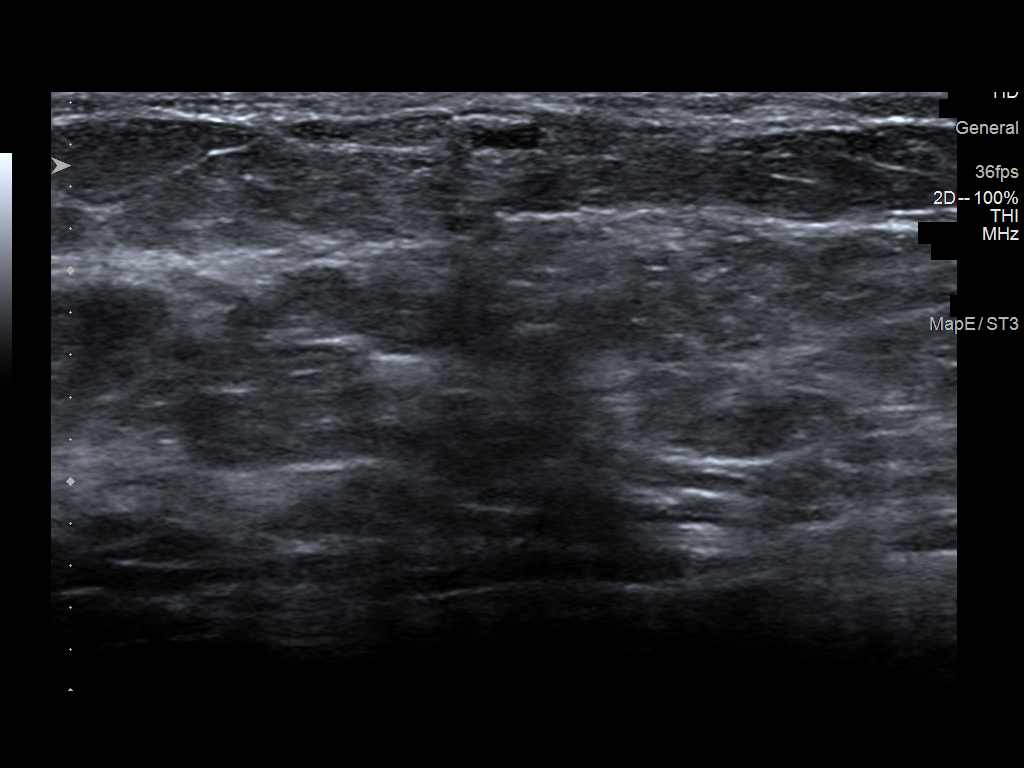
[im 6/7]
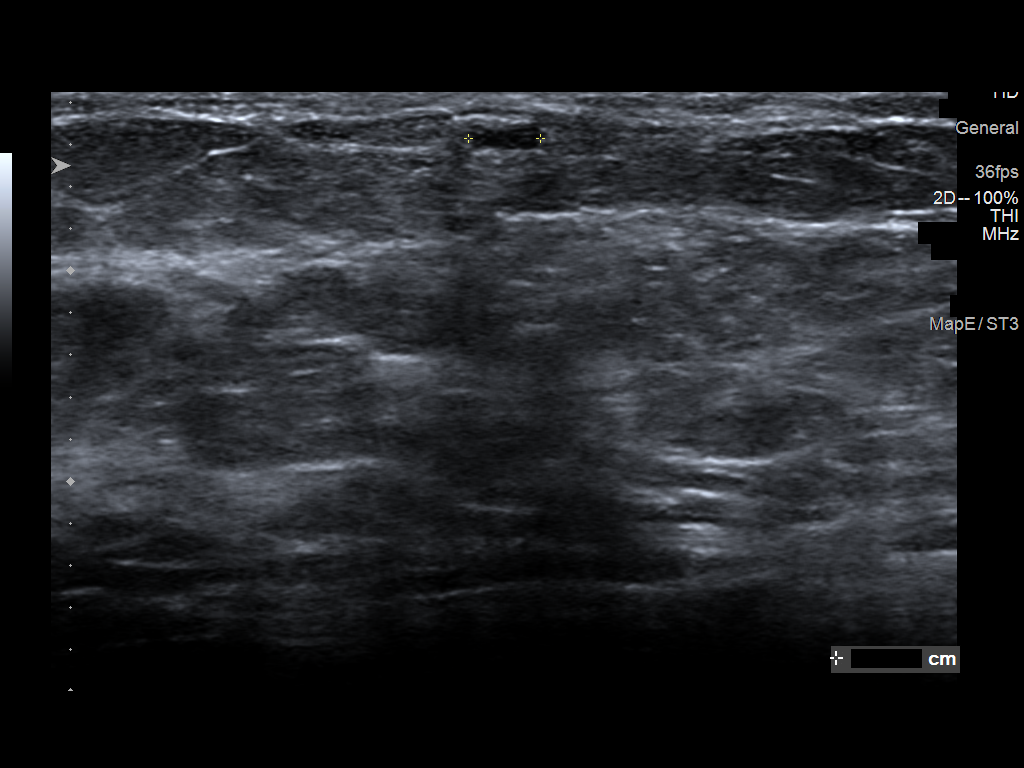
[im 7/7]
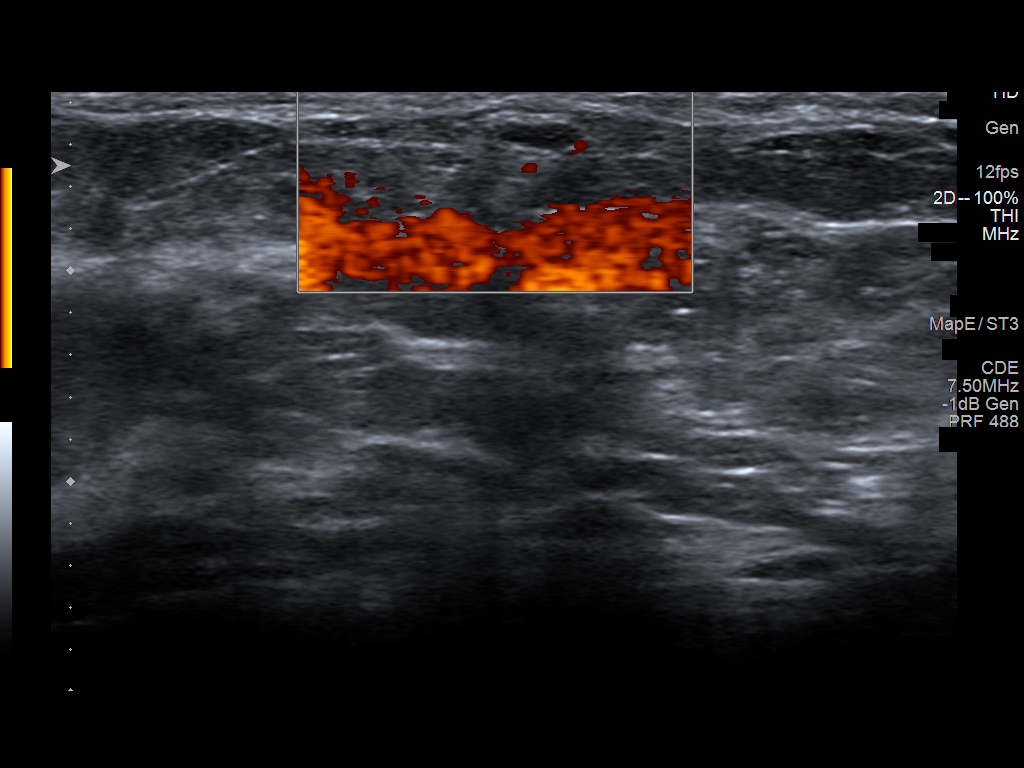

[7 of 7 positions shown; findings below may reference images not displayed]

ACR Breast Density Category b: There are scattered areas of
fibroglandular density.
FINDINGS: The possible mass noted on the screening exam persists as a small, 4
mm, oval circumscribed mass in the far lateral aspect of the left
breast. There are no other masses, no areas of architectural
distortion and no suspicious calcifications.

Targeted left breast ultrasound is performed, showing a superficial
oval cyst in the left breast at 2:30 o'clock, 13 cm the nipple,
measuring 3 x 1 x 3 mm, consistent in size, shape and location to
the mammographic mass. There are no solid masses or suspicious
lesions.
IMPRESSION: 1. No evidence of breast malignancy.
2. Small benign far lateral left breast cyst.

RECOMMENDATION:
Screening mammogram in one year.(Code:HB-X-TWB)

I have discussed the findings and recommendations with the patient.
If applicable, a reminder letter will be sent to the patient
regarding the next appointment.

BI-RADS CATEGORY  2: Benign.

## 2022-07-22 IMAGING — MG MM DIGITAL DIAGNOSTIC UNILAT*L* W/ TOMO W/ CAD
4 series · 4 of 12 positions shown · non-contrast
Comparison: 12/11/2020

CLINICAL DATA: Screening recall for a possible left breast mass.
Screening exam was the baseline study.

EXAM:
DIGITAL DIAGNOSTIC UNILATERAL LEFT MAMMOGRAM WITH TOMOSYNTHESIS AND
CAD; ULTRASOUND LEFT BREAST LIMITED
TECHNIQUE: Left digital diagnostic mammography and breast tomosynthesis was
performed. The images were evaluated with computer-aided detection.;
Targeted ultrasound examination of the left breast was performed.

[L MLO synth-2D]
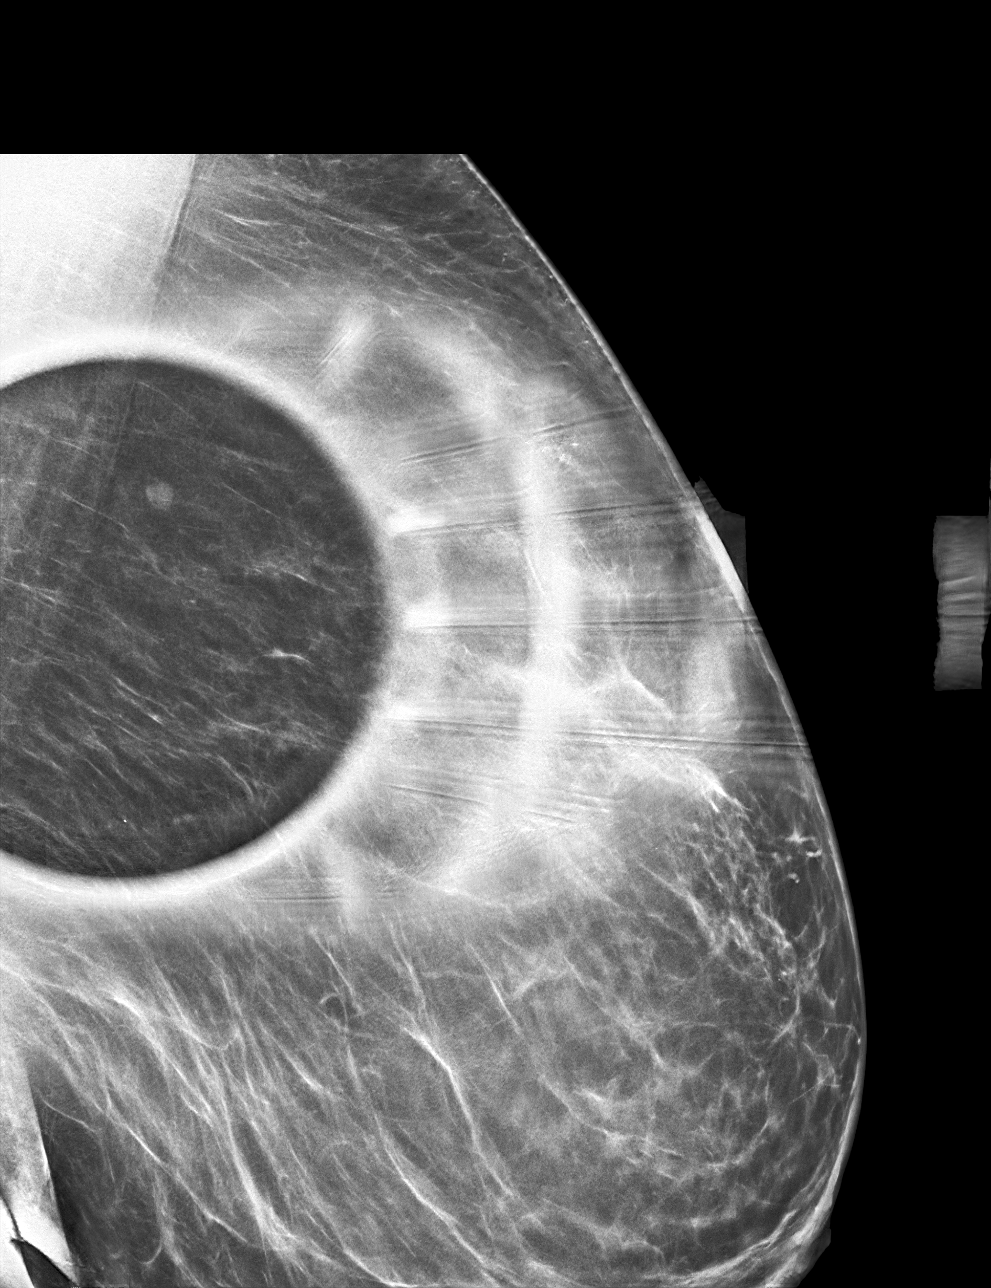

[L ML synth-2D]
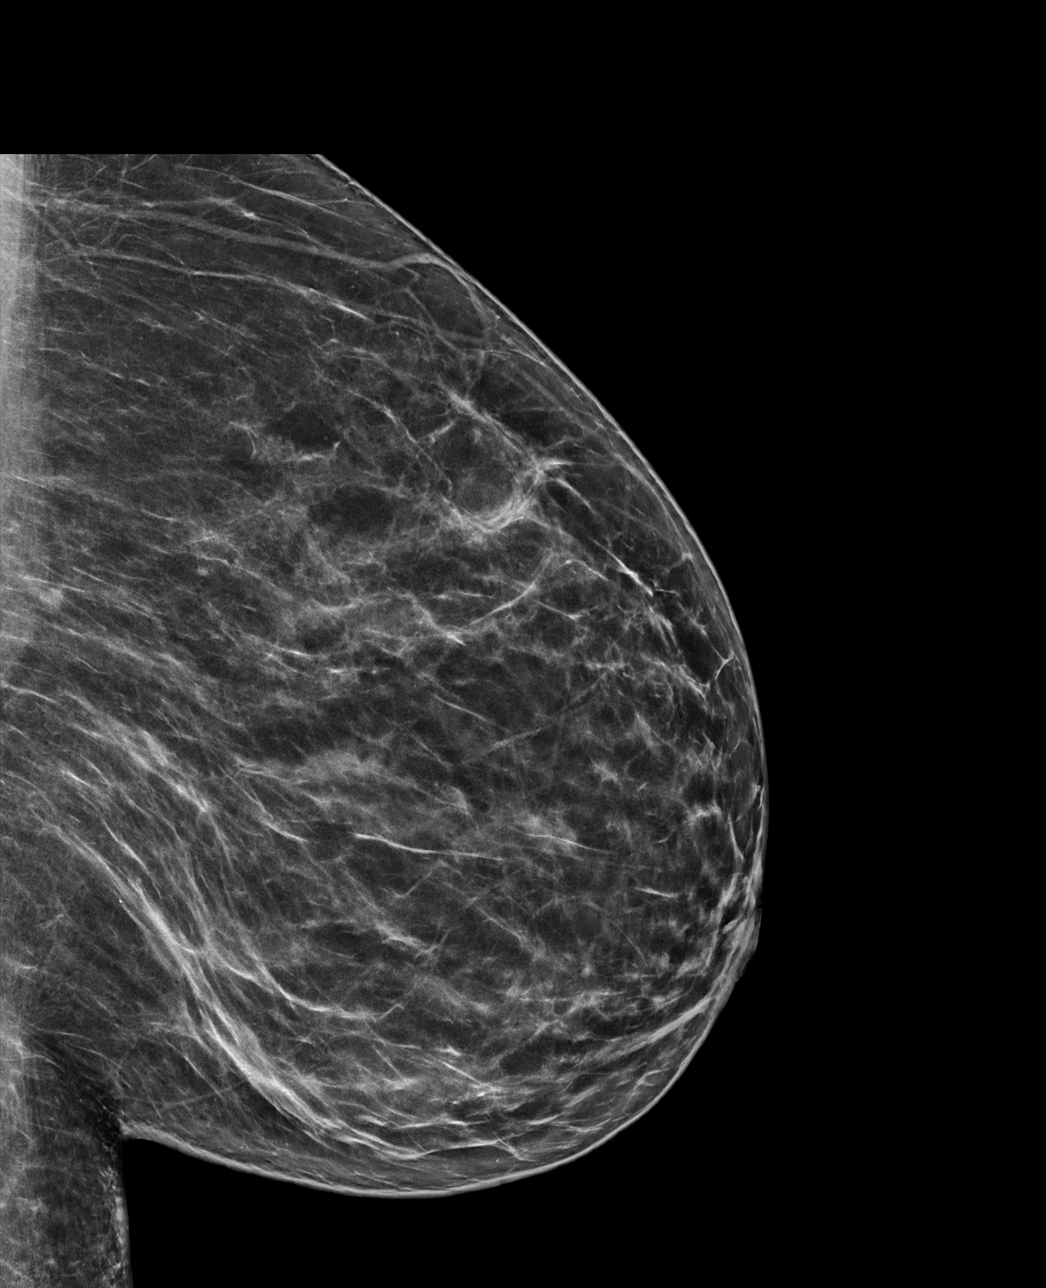

[L ML tomo · tomo slice 38/75.0]
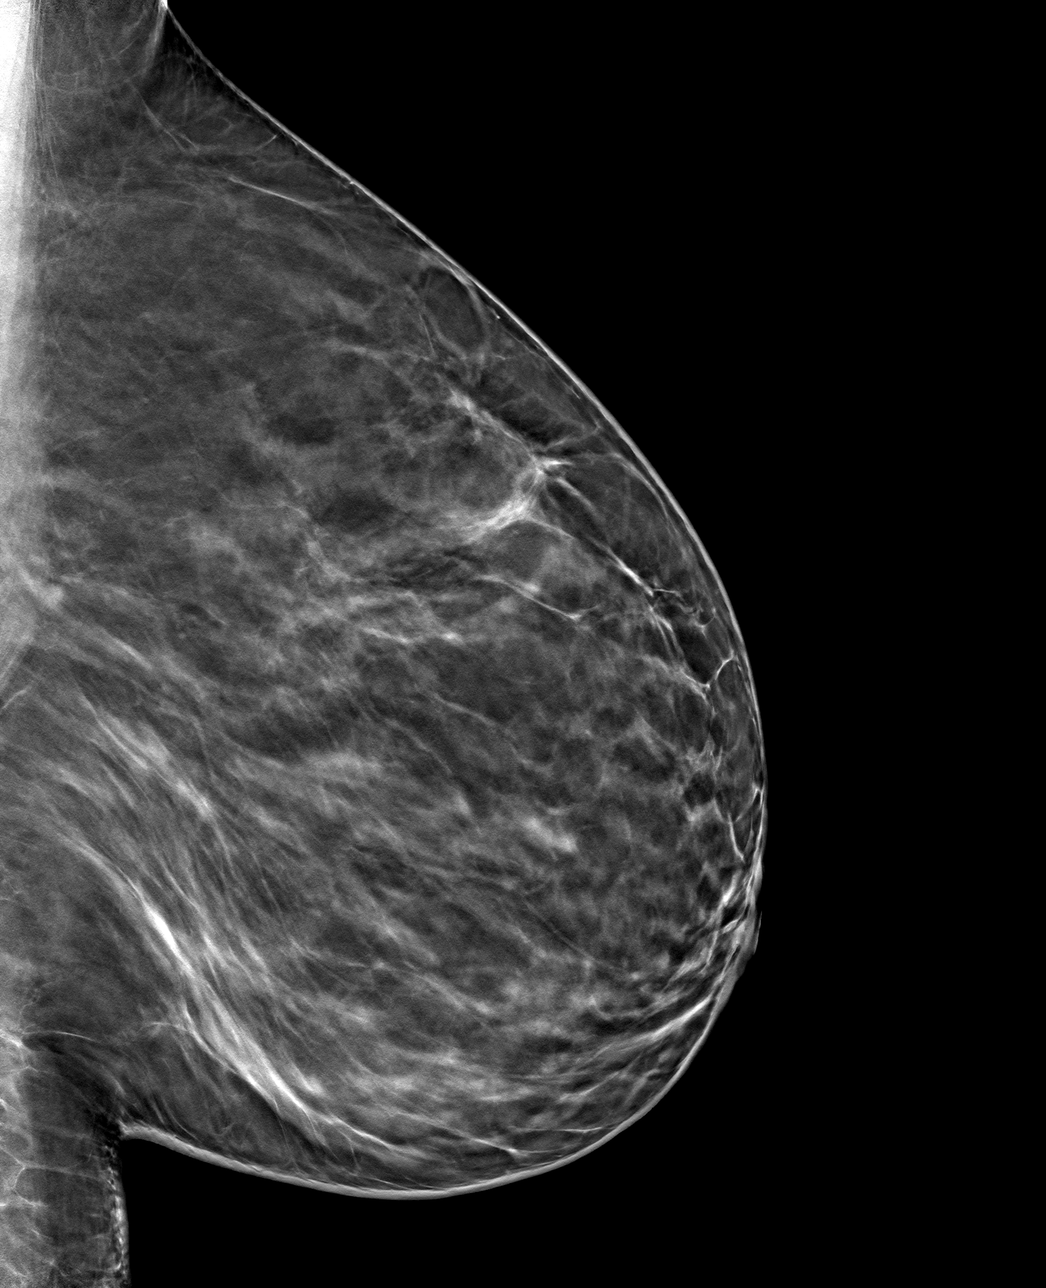

[L MLO tomo · tomo slice 37/73.0]
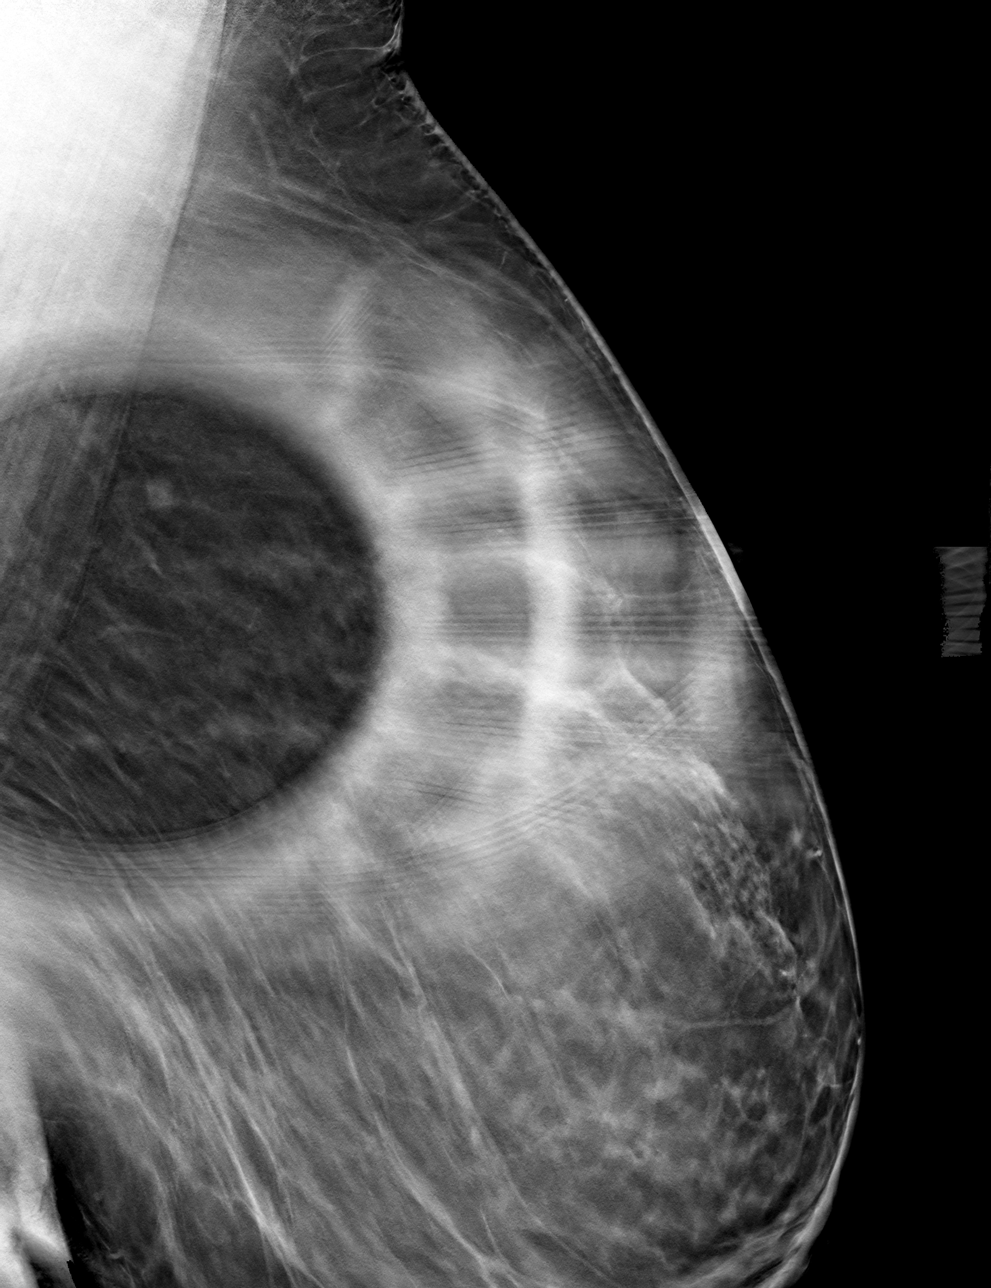

[4 of 12 positions shown; findings below may reference images not displayed]

ACR Breast Density Category b: There are scattered areas of
fibroglandular density.
FINDINGS: The possible mass noted on the screening exam persists as a small, 4
mm, oval circumscribed mass in the far lateral aspect of the left
breast. There are no other masses, no areas of architectural
distortion and no suspicious calcifications.

Targeted left breast ultrasound is performed, showing a superficial
oval cyst in the left breast at 2:30 o'clock, 13 cm the nipple,
measuring 3 x 1 x 3 mm, consistent in size, shape and location to
the mammographic mass. There are no solid masses or suspicious
lesions.
IMPRESSION: 1. No evidence of breast malignancy.
2. Small benign far lateral left breast cyst.

RECOMMENDATION:
Screening mammogram in one year.(Code:HB-X-TWB)

I have discussed the findings and recommendations with the patient.
If applicable, a reminder letter will be sent to the patient
regarding the next appointment.

BI-RADS CATEGORY  2: Benign.

## 2023-03-02 ENCOUNTER — Encounter: Payer: Self-pay | Admitting: Family Medicine

## 2023-03-02 ENCOUNTER — Ambulatory Visit (INDEPENDENT_AMBULATORY_CARE_PROVIDER_SITE_OTHER): Payer: BC Managed Care – PPO | Admitting: Family Medicine

## 2023-03-02 ENCOUNTER — Other Ambulatory Visit (HOSPITAL_BASED_OUTPATIENT_CLINIC_OR_DEPARTMENT_OTHER): Payer: Self-pay

## 2023-03-02 VITALS — BP 122/74 | HR 88 | Temp 99.0°F | Ht 69.0 in | Wt 240.4 lb

## 2023-03-02 DIAGNOSIS — B001 Herpesviral vesicular dermatitis: Secondary | ICD-10-CM

## 2023-03-02 DIAGNOSIS — Z0000 Encounter for general adult medical examination without abnormal findings: Secondary | ICD-10-CM | POA: Diagnosis not present

## 2023-03-02 DIAGNOSIS — Z1159 Encounter for screening for other viral diseases: Secondary | ICD-10-CM

## 2023-03-02 LAB — COMPREHENSIVE METABOLIC PANEL
ALT: 13 U/L (ref 0–35)
AST: 13 U/L (ref 0–37)
Albumin: 4 g/dL (ref 3.5–5.2)
Alkaline Phosphatase: 55 U/L (ref 39–117)
BUN: 9 mg/dL (ref 6–23)
CO2: 26 meq/L (ref 19–32)
Calcium: 8.7 mg/dL (ref 8.4–10.5)
Chloride: 108 meq/L (ref 96–112)
Creatinine, Ser: 0.78 mg/dL (ref 0.40–1.20)
GFR: 92.07 mL/min (ref >60.00)
Glucose, Bld: 94 mg/dL (ref 70–99)
Potassium: 4.1 meq/L (ref 3.5–5.1)
Sodium: 140 meq/L (ref 135–145)
Total Bilirubin: 0.4 mg/dL (ref 0.2–1.2)
Total Protein: 6.4 g/dL (ref 6.0–8.3)

## 2023-03-02 LAB — LIPID PANEL
Cholesterol: 152 mg/dL (ref 0–200)
HDL: 42.4 mg/dL (ref >39.00)
LDL Cholesterol: 96 mg/dL (ref 0–99)
NonHDL: 110.02
Total CHOL/HDL Ratio: 4
Triglycerides: 71 mg/dL (ref 0.0–149.0)
VLDL: 14.2 mg/dL (ref 0.0–40.0)

## 2023-03-02 LAB — HEMOGLOBIN A1C: Hgb A1c MFr Bld: 6.1 % (ref 4.6–6.5)

## 2023-03-02 LAB — T4, FREE: Free T4: 0.76 ng/dL (ref 0.60–1.60)

## 2023-03-02 LAB — TSH: TSH: 1.8 u[IU]/mL (ref 0.35–5.50)

## 2023-03-02 MED ORDER — VALACYCLOVIR HCL 500 MG PO TABS
1000.0000 mg | ORAL_TABLET | Freq: Two times a day (BID) | ORAL | 0 refills | Status: AC | PRN
Start: 2023-03-02 — End: ?
  Filled 2023-03-02: qty 120, 30d supply, fill #0

## 2023-03-02 NOTE — Progress Notes (Signed)
 Established Patient Office Visit   Subjective  Patient ID: Sandra Roberts, female    DOB: [birth year]  Age: 43 y.o. MRN: [id number]  Chief Complaint  Patient presents with   Annual Exam    Patient is a 43 year old female seen for CPE.  Patient states she has been doing well overall.  Requesting refill on Valtrex  for cold sores and naris.  Recent episode started 2 days ago.  Patient had influenza vaccine 11/08/2022 at [facility].  Had mammogram.  Patient denies family history of colon cancer.    Patient Active Problem List   Diagnosis Date Noted   Vaginal birth after cesarean 08/20/2018   [redacted] weeks gestation of pregnancy 08/19/2018   Female infertility 03/30/2017   Past Medical History:  Diagnosis Date   H/O cold sores    Varicella    Past Surgical History:  Procedure Laterality Date   CESAREAN SECTION     ECTOPIC PREGNANCY SURGERY     Social History   Tobacco Use   Smoking status: Never   Smokeless tobacco: Never  Vaping Use   Vaping status: Never Used  Substance Use Topics   Alcohol use: No   Drug use: No   Family History  Problem Relation Age of Onset   Hypertension Mother    Cancer Maternal Grandmother    Hearing loss Maternal Grandmother    Hypertension Maternal Grandmother    Pulmonary embolism Maternal Grandmother    Cancer Father    Asthma Daughter    Hearing loss Paternal Grandmother    Heart disease Paternal Grandmother    Hypertension Paternal Grandmother    Hyperlipidemia Paternal Grandmother    Stroke Paternal Grandmother    Hearing loss Paternal Grandfather    Heart disease Paternal Grandfather    Hyperlipidemia Paternal Grandfather    Hypertension Paternal Grandfather    Stroke Paternal Grandfather    Allergies  Allergen Reactions   Cephalexin Rash    Peeled skin      ROS Negative unless stated above    Objective:     BP 122/74 (BP Location: Left Arm, Patient Position: Sitting, Cuff Size: Large)   Pulse 88   Temp 99 F (37.2  C) (Oral)   Ht 5' 9 (1.753 m)   Wt 240 lb 6.4 oz (109 kg)   LMP 02/03/2023 (Exact Date)   SpO2 99%   BMI 35.50 kg/m  BP Readings from Last 3 Encounters:  03/02/23 122/74  10/03/20 128/84  09/14/20 127/81   Wt Readings from Last 3 Encounters:  03/02/23 240 lb 6.4 oz (109 kg)  10/03/20 224 lb 6.4 oz (101.8 kg)  09/14/20 226 lb (102.5 kg)      Physical Exam Constitutional:      Appearance: Normal appearance.  HENT:     Head: Normocephalic and atraumatic.     Right Ear: Tympanic membrane, ear canal and external ear normal.     Left Ear: Tympanic membrane, ear canal and external ear normal.     Nose: Nose normal.     Right Turbinates: Swollen.     Left Turbinates: Swollen.     Comments: A cluster of blisters in right naris.    Mouth/Throat:     Mouth: Mucous membranes are moist.     Pharynx: No oropharyngeal exudate or posterior oropharyngeal erythema.  Eyes:     General: No scleral icterus.    Extraocular Movements: Extraocular movements intact.     Conjunctiva/sclera: Conjunctivae normal.     Pupils: Pupils are  equal, round, and reactive to light.  Neck:     Thyroid : No thyromegaly.  Cardiovascular:     Rate and Rhythm: Normal rate and regular rhythm.     Pulses: Normal pulses.     Heart sounds: Normal heart sounds. No murmur heard.    No friction rub.  Pulmonary:     Effort: Pulmonary effort is normal.     Breath sounds: Normal breath sounds. No wheezing, rhonchi or rales.  Abdominal:     General: Bowel sounds are normal.     Palpations: Abdomen is soft.     Tenderness: There is no abdominal tenderness.  Musculoskeletal:        General: No deformity. Normal range of motion.  Lymphadenopathy:     Cervical: No cervical adenopathy.  Skin:    General: Skin is warm and dry.     Findings: No lesion.  Neurological:     General: No focal deficit present.     Mental Status: She is alert and oriented to person, place, and time.  Psychiatric:        Mood and  Affect: Mood normal.        Thought Content: Thought content normal.     No results found for any visits on 03/02/23.    Assessment & Plan:  Well adult exam -Age-appropriate health screenings discussed -Obtain labs -Immunizations reviewed. -Colonoscopy due in about 1 month.  Will place referral at this time. -Mammogram up-to-date -Pap done 12/26/2021 with OB/GYN -Next CPE in 1 year -     CBC with Differential/Platelet; Future -     Comprehensive metabolic panel; Future -     Hemoglobin A1c; Future -     Lipid panel; Future -     TSH; Future -     T4, free; Future  Herpes labialis -Acute flare in nares -Valtrex  refilled. -     valACYclovir  HCl; Take 1000 mg (2 tabs) twice a day as needed.  Dispense: 120 tablet; Refill: 0  Encounter for hepatitis C screening test for low risk patient -     Hepatitis C antibody    Return if symptoms worsen or fail to improve.   Clotilda JONELLE Single, MD

## 2023-03-03 LAB — HEPATITIS C ANTIBODY: Hepatitis C Ab: NONREACTIVE

## 2023-03-04 LAB — CBC WITH DIFFERENTIAL/PLATELET
Basophils Absolute: 0 10*3/uL (ref 0.0–0.1)
Basophils Relative: 0.6 % (ref 0.0–3.0)
Eosinophils Absolute: 0.1 10*3/uL (ref 0.0–0.7)
Eosinophils Relative: 1.7 % (ref 0.0–5.0)
HCT: 32 % — ABNORMAL LOW (ref 36.0–46.0)
Hemoglobin: 9.8 g/dL — ABNORMAL LOW (ref 12.0–15.0)
Lymphocytes Relative: 32.5 % (ref 12.0–46.0)
Lymphs Abs: 2 10*3/uL (ref 0.7–4.0)
MCHC: 30.8 g/dL (ref 30.0–36.0)
MCV: 69.2 fL — ABNORMAL LOW (ref 78.0–100.0)
Monocytes Absolute: 0.3 10*3/uL (ref 0.1–1.0)
Monocytes Relative: 4.2 % (ref 3.0–12.0)
Neutro Abs: 3.7 10*3/uL (ref 1.4–7.7)
Neutrophils Relative %: 61 % (ref 43.0–77.0)
Platelets: 400 10*3/uL (ref 150.0–400.0)
RBC: 4.62 Mil/uL (ref 3.87–5.11)
RDW: 19.8 % — ABNORMAL HIGH (ref 11.5–15.5)
WBC: 6.1 10*3/uL (ref 4.0–10.5)

## 2023-03-05 ENCOUNTER — Other Ambulatory Visit: Payer: Self-pay

## 2023-03-05 ENCOUNTER — Other Ambulatory Visit (HOSPITAL_BASED_OUTPATIENT_CLINIC_OR_DEPARTMENT_OTHER): Payer: Self-pay

## 2023-03-05 ENCOUNTER — Other Ambulatory Visit: Payer: Self-pay | Admitting: Family Medicine

## 2023-03-05 DIAGNOSIS — D649 Anemia, unspecified: Secondary | ICD-10-CM | POA: Insufficient documentation

## 2023-03-05 MED ORDER — FERROUS GLUCONATE 324 (38 FE) MG PO TABS
324.0000 mg | ORAL_TABLET | Freq: Every day | ORAL | 3 refills | Status: AC
  Filled 2023-03-05: qty 90, 90d supply, fill #0
  Filled 2024-01-19: qty 90, 90d supply, fill #1

## 2023-07-01 ENCOUNTER — Other Ambulatory Visit: Payer: Self-pay | Admitting: Family Medicine

## 2023-07-01 DIAGNOSIS — Z1211 Encounter for screening for malignant neoplasm of colon: Secondary | ICD-10-CM

## 2023-08-14 ENCOUNTER — Encounter: Payer: Self-pay | Admitting: Gastroenterology

## 2023-08-26 ENCOUNTER — Telehealth: Payer: Self-pay | Admitting: Family Medicine

## 2023-08-26 NOTE — Telephone Encounter (Signed)
 Patient and spouse Sandra Roberts) dropped off forms from [facility] [employer]. Forms in folder

## 2023-08-31 ENCOUNTER — Telehealth: Payer: Self-pay

## 2023-08-31 DIAGNOSIS — Z0279 Encounter for issue of other medical certificate: Secondary | ICD-10-CM

## 2023-08-31 NOTE — Telephone Encounter (Signed)
 Called patient left a

## 2023-08-31 NOTE — Telephone Encounter (Signed)
 Patient's form is at the front desk ready for pick-up, left patient a VM patient is aware

## 2023-08-31 NOTE — Telephone Encounter (Signed)
Left a VM to return call

## 2023-09-04 NOTE — Telephone Encounter (Signed)
 Matter addressed

## 2023-10-05 ENCOUNTER — Encounter: Admitting: Gastroenterology

## 2024-01-19 ENCOUNTER — Other Ambulatory Visit (HOSPITAL_BASED_OUTPATIENT_CLINIC_OR_DEPARTMENT_OTHER): Payer: Self-pay
